# Patient Record
Sex: Male | Born: 1940 | Race: Black or African American | Hispanic: No | Marital: Married | Smoking: Former smoker
Health system: Southern US, Community
[De-identification: ages and names within clinical notes are randomized; demographics above are authoritative.]

## PROBLEM LIST (undated history)

## (undated) DIAGNOSIS — I119 Hypertensive heart disease without heart failure: Secondary | ICD-10-CM

## (undated) DIAGNOSIS — Z79899 Other long term (current) drug therapy: Secondary | ICD-10-CM

## (undated) DIAGNOSIS — R2240 Localized swelling, mass and lump, unspecified lower limb: Secondary | ICD-10-CM

## (undated) DIAGNOSIS — I4430 Unspecified atrioventricular block: Secondary | ICD-10-CM

## (undated) DIAGNOSIS — I509 Heart failure, unspecified: Secondary | ICD-10-CM

## (undated) DIAGNOSIS — R972 Elevated prostate specific antigen [PSA]: Secondary | ICD-10-CM

## (undated) DIAGNOSIS — E119 Type 2 diabetes mellitus without complications: Secondary | ICD-10-CM

## (undated) DIAGNOSIS — Z8546 Personal history of malignant neoplasm of prostate: Secondary | ICD-10-CM

## (undated) DIAGNOSIS — K219 Gastro-esophageal reflux disease without esophagitis: Secondary | ICD-10-CM

## (undated) DIAGNOSIS — R109 Unspecified abdominal pain: Secondary | ICD-10-CM

## (undated) DIAGNOSIS — H2640 Unspecified secondary cataract: Secondary | ICD-10-CM

## (undated) DIAGNOSIS — M1990 Unspecified osteoarthritis, unspecified site: Secondary | ICD-10-CM

## (undated) DIAGNOSIS — E7800 Pure hypercholesterolemia, unspecified: Secondary | ICD-10-CM

## (undated) DIAGNOSIS — W57XXXA Bitten or stung by nonvenomous insect and other nonvenomous arthropods, initial encounter: Secondary | ICD-10-CM

## (undated) DIAGNOSIS — N183 Chronic kidney disease, stage 3 unspecified: Secondary | ICD-10-CM

## (undated) DIAGNOSIS — I440 Atrioventricular block, first degree: Secondary | ICD-10-CM

## (undated) DIAGNOSIS — R0609 Other forms of dyspnea: Secondary | ICD-10-CM

## (undated) DIAGNOSIS — M25561 Pain in right knee: Secondary | ICD-10-CM

## (undated) DIAGNOSIS — M791 Myalgia, unspecified site: Secondary | ICD-10-CM

## (undated) DIAGNOSIS — I10 Essential (primary) hypertension: Secondary | ICD-10-CM

## (undated) DIAGNOSIS — E109 Type 1 diabetes mellitus without complications: Secondary | ICD-10-CM

## (undated) DIAGNOSIS — C61 Malignant neoplasm of prostate: Secondary | ICD-10-CM

## (undated) DIAGNOSIS — N189 Chronic kidney disease, unspecified: Secondary | ICD-10-CM

## (undated) HISTORY — DX: Pain in right knee: M25.561

## (undated) HISTORY — DX: Type 2 diabetes mellitus without complications: E11.9

## (undated) HISTORY — PX: PROSTATE BIOPSY: SHX241

## (undated) HISTORY — DX: Bitten or stung by nonvenomous insect and other nonvenomous arthropods, initial encounter: W57.XXXA

## (undated) HISTORY — DX: Chronic kidney disease, stage 3 unspecified: N18.30

## (undated) HISTORY — DX: Personal history of malignant neoplasm of prostate: Z85.46

## (undated) HISTORY — DX: Other long term (current) drug therapy: Z79.899

## (undated) HISTORY — DX: Hypertensive heart disease without heart failure: I11.9

## (undated) HISTORY — DX: Gastro-esophageal reflux disease without esophagitis: K21.9

## (undated) HISTORY — PX: TONSILLECTOMY: SUR1361

## (undated) HISTORY — DX: Myalgia, unspecified site: M79.10

## (undated) HISTORY — DX: Unspecified osteoarthritis, unspecified site: M19.90

## (undated) HISTORY — DX: Elevated prostate specific antigen (PSA): R97.20

## (undated) HISTORY — DX: Unspecified atrioventricular block: I44.30

## (undated) HISTORY — DX: Unspecified secondary cataract: H26.40

## (undated) HISTORY — DX: Other forms of dyspnea: R06.09

## (undated) HISTORY — DX: Pure hypercholesterolemia, unspecified: E78.00

## (undated) HISTORY — DX: Type 1 diabetes mellitus without complications: E10.9

## (undated) HISTORY — DX: Localized swelling, mass and lump, unspecified lower limb: R22.40

## (undated) HISTORY — DX: Unspecified abdominal pain: R10.9

## (undated) HISTORY — PX: PROSTATECTOMY: SHX69

---

## 2007-10-29 ENCOUNTER — Ambulatory Visit (HOSPITAL_COMMUNITY): Admission: RE | Admit: 2007-10-29 | Discharge: 2007-10-29 | Payer: Self-pay | Admitting: Urology

## 2009-09-13 ENCOUNTER — Ambulatory Visit: Admission: RE | Admit: 2009-09-13 | Discharge: 2009-12-03 | Payer: Self-pay | Admitting: Radiation Oncology

## 2009-10-04 ENCOUNTER — Ambulatory Visit (HOSPITAL_COMMUNITY): Admission: RE | Admit: 2009-10-04 | Discharge: 2009-10-04 | Payer: Self-pay | Admitting: Radiation Oncology

## 2009-12-06 ENCOUNTER — Ambulatory Visit: Admission: RE | Admit: 2009-12-06 | Discharge: 2009-12-10 | Payer: Self-pay | Admitting: Radiation Oncology

## 2010-12-25 ENCOUNTER — Encounter: Payer: Self-pay | Admitting: Urology

## 2013-10-10 ENCOUNTER — Other Ambulatory Visit (HOSPITAL_COMMUNITY): Payer: Self-pay | Admitting: Urology

## 2013-10-10 DIAGNOSIS — C61 Malignant neoplasm of prostate: Secondary | ICD-10-CM

## 2013-10-27 ENCOUNTER — Encounter (HOSPITAL_COMMUNITY): Payer: Self-pay

## 2013-10-27 ENCOUNTER — Encounter (HOSPITAL_COMMUNITY)
Admission: RE | Admit: 2013-10-27 | Discharge: 2013-10-27 | Disposition: A | Discharge status: Home / Self Care | Payer: Medicare Other | Source: Ambulatory Visit | Attending: Urology | Admitting: Urology

## 2013-10-27 DIAGNOSIS — C61 Malignant neoplasm of prostate: Secondary | ICD-10-CM

## 2013-10-27 HISTORY — DX: Malignant neoplasm of prostate: C61

## 2013-10-27 MED ORDER — TECHNETIUM TC 99M MEDRONATE IV KIT
26.0000 | PACK | Freq: Once | INTRAVENOUS | Status: AC | PRN
  Administered 2013-10-27: 26 via INTRAVENOUS

## 2014-12-15 ENCOUNTER — Other Ambulatory Visit (HOSPITAL_COMMUNITY): Payer: Self-pay | Admitting: Urology

## 2014-12-15 DIAGNOSIS — C61 Malignant neoplasm of prostate: Secondary | ICD-10-CM

## 2014-12-24 ENCOUNTER — Encounter (HOSPITAL_COMMUNITY)
Admission: RE | Admit: 2014-12-24 | Discharge: 2014-12-24 | Disposition: A | Discharge status: Home / Self Care | Payer: Medicare Other | Source: Ambulatory Visit | Attending: Urology | Admitting: Urology

## 2014-12-24 DIAGNOSIS — C61 Malignant neoplasm of prostate: Secondary | ICD-10-CM

## 2014-12-24 MED ORDER — TECHNETIUM TC 99M MEDRONATE IV KIT
25.0000 | PACK | Freq: Once | INTRAVENOUS | Status: AC | PRN
  Administered 2014-12-24: 25 via INTRAVENOUS

## 2015-02-16 ENCOUNTER — Other Ambulatory Visit (HOSPITAL_COMMUNITY): Payer: Self-pay | Admitting: Urology

## 2015-02-16 DIAGNOSIS — C61 Malignant neoplasm of prostate: Secondary | ICD-10-CM

## 2015-02-24 ENCOUNTER — Encounter (HOSPITAL_COMMUNITY)
Admission: RE | Admit: 2015-02-24 | Discharge: 2015-02-24 | Disposition: A | Discharge status: Home / Self Care | Payer: Medicare Other | Source: Ambulatory Visit | Attending: Urology | Admitting: Urology

## 2015-02-24 DIAGNOSIS — C61 Malignant neoplasm of prostate: Secondary | ICD-10-CM | POA: Insufficient documentation

## 2015-02-24 MED ORDER — TECHNETIUM TC 99M MEDRONATE IV KIT
25.0000 | PACK | Freq: Once | INTRAVENOUS | Status: AC | PRN
  Administered 2015-02-24: 25 via INTRAVENOUS

## 2015-05-20 ENCOUNTER — Other Ambulatory Visit (HOSPITAL_COMMUNITY): Payer: Self-pay | Admitting: Urology

## 2015-05-20 DIAGNOSIS — C61 Malignant neoplasm of prostate: Secondary | ICD-10-CM

## 2015-08-17 ENCOUNTER — Ambulatory Visit (HOSPITAL_COMMUNITY)
Admission: RE | Admit: 2015-08-17 | Discharge: 2015-08-17 | Disposition: A | Discharge status: Home / Self Care | Payer: Self-pay | Source: Ambulatory Visit | Attending: Urology | Admitting: Urology

## 2015-08-17 ENCOUNTER — Encounter (HOSPITAL_COMMUNITY)
Admission: RE | Admit: 2015-08-17 | Discharge: 2015-08-17 | Disposition: A | Discharge status: Home / Self Care | Payer: Self-pay | Source: Ambulatory Visit | Attending: Urology | Admitting: Urology

## 2015-08-17 DIAGNOSIS — C61 Malignant neoplasm of prostate: Secondary | ICD-10-CM | POA: Insufficient documentation

## 2015-08-17 MED ORDER — TECHNETIUM TC 99M MEDRONATE IV KIT
26.5000 | PACK | Freq: Once | INTRAVENOUS | Status: AC | PRN
  Administered 2015-08-17: 26.5 via INTRAVENOUS

## 2015-09-27 ENCOUNTER — Encounter: Payer: Self-pay | Admitting: Interventional Cardiology

## 2015-09-29 DIAGNOSIS — R9431 Abnormal electrocardiogram [ECG] [EKG]: Secondary | ICD-10-CM | POA: Insufficient documentation

## 2015-09-30 ENCOUNTER — Ambulatory Visit (INDEPENDENT_AMBULATORY_CARE_PROVIDER_SITE_OTHER): Payer: Medicare Other | Admitting: Interventional Cardiology

## 2015-09-30 ENCOUNTER — Encounter: Payer: Self-pay | Admitting: Interventional Cardiology

## 2015-09-30 VITALS — BP 160/84 | HR 66 | Ht 67.5 in | Wt 218.1 lb

## 2015-09-30 DIAGNOSIS — R9431 Abnormal electrocardiogram [ECG] [EKG]: Secondary | ICD-10-CM | POA: Diagnosis not present

## 2015-09-30 DIAGNOSIS — N182 Chronic kidney disease, stage 2 (mild): Secondary | ICD-10-CM

## 2015-09-30 DIAGNOSIS — I10 Essential (primary) hypertension: Secondary | ICD-10-CM | POA: Diagnosis not present

## 2015-09-30 DIAGNOSIS — I5032 Chronic diastolic (congestive) heart failure: Secondary | ICD-10-CM | POA: Diagnosis not present

## 2015-09-30 DIAGNOSIS — E1122 Type 2 diabetes mellitus with diabetic chronic kidney disease: Secondary | ICD-10-CM

## 2015-09-30 DIAGNOSIS — E119 Type 2 diabetes mellitus without complications: Secondary | ICD-10-CM

## 2015-09-30 LAB — BASIC METABOLIC PANEL
BUN: 24 mg/dL (ref 7–25)
CALCIUM: 8.9 mg/dL (ref 8.6–10.3)
CO2: 26 mmol/L (ref 20–31)
Chloride: 102 mmol/L (ref 98–110)
Creat: 1.1 mg/dL (ref 0.70–1.18)
GLUCOSE: 74 mg/dL (ref 65–99)
Potassium: 4.2 mmol/L (ref 3.5–5.3)
SODIUM: 138 mmol/L (ref 135–146)

## 2015-09-30 LAB — BRAIN NATRIURETIC PEPTIDE: BRAIN NATRIURETIC PEPTIDE: 141.8 pg/mL — AB (ref 0.0–100.0)

## 2015-09-30 NOTE — Progress Notes (Signed)
Cardiology Office Note   Date:  09/30/2015   ID:  [name], DOB 11/16/1941, MRN [id number]  PCP:  Ian Hey, MD  Cardiologist:  Sinclair Grooms, MD   Chief Complaint  Patient presents with  . Abnormal ECG      History of Present Illness: Ian Dudley is a 74 y.o. male who presents for abnormal EKG.  The patient has recurrent prostate cancer and was referred by [facility], Dr. Janice Dudley. He was enrolled in a drug therapy trial that required an electrocardiogram. When this was performed in March 2016 there was evidence of inferolateral T-wave inversion compatible with ischemia. This prompted the cardiology referral.  In speaking with the patient there is no dyspnea at rest, chest discomfort, arm discomfort, or other ischemic complaints. He does note gradual exertional fatigue over the last several years. There is no arm discomfort, or claudication.  He has history of recurrent prostate cancer and is on an experimental agent. This is causing him to develop breast tissue/gynecomastia. Otherwise no complaints.    Past Medical History  Diagnosis Date  . Prostate cancer ([place])   . Diabetes mellitus type I [facility])     Past Surgical History  Procedure Laterality Date  . Prostatectomy    . Tonsillectomy       Current Outpatient Prescriptions  Medication Sig Dispense Refill  . aspirin 325 MG tablet Take 325 mg by mouth daily as needed for mild pain.    Marland Kitchen glipiZIDE (GLUCOTROL) 10 MG tablet Take 10 mg by mouth daily before breakfast.    . Investigational - Study Medication Take 4 tablets by mouth daily. Additional Study Details: For Prostate Cancer    . metFORMIN (GLUCOPHAGE) 500 MG tablet Take 500 mg by mouth daily with breakfast.     . pioglitazone (ACTOS) 30 MG tablet Take 30 mg by mouth daily.     No current facility-administered medications for this visit.    Allergies:   Review of patient's allergies indicates no known allergies.    Social  History:  The patient  reports that he has quit smoking. He has never used smokeless tobacco. He reports that he does not drink alcohol or use illicit drugs.   Family History:  The patient's family history includes Breast cancer in his sister; Colon cancer in his brother and sister; Diabetes in his mother; Healthy in his brother, brother, and sister; Hypertension in his mother; Kidney failure in his mother; Lupus in his sister; Prostate cancer in his brother.    ROS:  Please see the history of present illness.   Otherwise, review of systems are positive for gynecomastia..   All other systems are reviewed and negative.    PHYSICAL EXAM: VS:  BP 160/84 mmHg  Pulse 66  Ht 5' 7.5" (1.715 m)  Wt 98.939 kg (218 lb 1.9 oz)  BMI 33.64 kg/m2 , BMI Body mass index is 33.64 kg/(m^2). GEN: Well nourished, well developed, in no acute distress HEENT: normal Neck: no JVD, carotid bruits, or masses Cardiac: RRR.  There is no murmur, rub, or gallop. There is no edema. Respiratory:  clear to auscultation bilaterally, normal work of breathing. GI: soft, nontender, nondistended, + BS MS: no deformity or atrophy Skin: warm and dry, no rash Neuro:  Strength and sensation are intact Psych: euthymic mood, full affect   EKG:  EKG is ordered today. The ekg reveals normal sinus rhythm with nonspecific T-wave abnormality.   Recent Labs: No results found for requested  labs within last 365 days.    Lipid Panel No results found for: CHOL, TRIG, HDL, CHOLHDL, VLDL, LDLCALC, LDLDIRECT    Wt Readings from Last 3 Encounters:  09/30/15 98.939 kg (218 lb 1.9 oz)      Other studies Reviewed: Additional studies/ records that were reviewed today include: Review of [facility] neurology data. The findings include the EKG of concern reveal clear inferolateral T-wave inversion potentially consistent with ischemia..    ASSESSMENT AND PLAN:  1. Abnormal EKG with T-wave abnormality EKG performed earlier this  year demonstrated symmetrical inferior T-wave abnormality consistent with ischemia. Current EKG is not consistent and only reveals nonspecific ST abnormality. Could have CAD and silent ischemia.  2. Possible diastolic heart failure There is clinical evidence of volume overload.   3. Elevated blood pressure and probable essential hypertension Markedly elevated systolic pressure  4. Prostate cancer, metastatic On study drug  5. 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  Current medicines are reviewed at length with the patient today.  The patient has the following concerns regarding medicines: none..  The following changes/actions have been instituted:    Basic metabolic panel and BNP  No concern about the patient's current EKG appearance with reference to his current study med.  It basic metabolic panel is okay, will start antihypertensive regimen that that includes an Ace or ARB given the diabetes history. Will also need a diuretic regimen is there is evidence of volume overload.  Myocardial perfusion imaging  Labs/ tests ordered today include:   Orders Placed This Encounter  Procedures  . B Nat Peptide  . Basic Metabolic Panel (BMET)  . Myocardial Perfusion Imaging  . EKG 12-Lead     Disposition:   FU with HS in PRN    Signed, Sinclair Grooms, MD  09/30/2015 1:46 PM    [facility] [address], [place], [place]  [address] Phone: [phone]; Fax: [phone]

## 2015-09-30 NOTE — Patient Instructions (Signed)
Your physician recommends that you continue on your current medications as directed. Please refer to the Current Medication list given to you today.  Your physician recommends that you return for lab work in: today (BNP, BMET)  Follow up with your physician will depend on test results.

## 2015-10-04 ENCOUNTER — Telehealth: Payer: Self-pay

## 2015-10-04 NOTE — Telephone Encounter (Signed)
-----   Message from Belva Crome, MD sent at 09/30/2015  6:54 PM EDT ----- Normal

## 2015-10-04 NOTE — Telephone Encounter (Signed)
Called to give pt lab results.lmtcb  

## 2015-10-06 NOTE — Telephone Encounter (Signed)
Called to give pt lab results. lmom labs are normal

## 2015-10-06 NOTE — Telephone Encounter (Signed)
F/u    Pt returning Lisa's call.

## 2015-10-11 ENCOUNTER — Telehealth (HOSPITAL_COMMUNITY): Payer: Self-pay

## 2015-10-11 ENCOUNTER — Other Ambulatory Visit: Payer: Self-pay | Admitting: Urology

## 2015-10-11 DIAGNOSIS — C61 Malignant neoplasm of prostate: Secondary | ICD-10-CM

## 2015-10-11 NOTE — Telephone Encounter (Signed)
Patient given detailed instructions per Myocardial Perfusion Study Information Sheet for the test on 10-13-2015 at 1000. Patient notified to arrive 15 minutes early and that it is imperative to arrive on time for appointment to keep from having the test rescheduled.  If you need to cancel or reschedule your appointment, please call the office within 24 hours of your appointment. Failure to do so may result in a cancellation of your appointment, and a $50 no show fee. Patient verbalized understanding.Oletta Lamas, Mikiah Durall A

## 2015-10-13 ENCOUNTER — Ambulatory Visit (HOSPITAL_COMMUNITY): Payer: Medicare Other | Attending: Cardiology

## 2015-10-13 DIAGNOSIS — R5383 Other fatigue: Secondary | ICD-10-CM | POA: Insufficient documentation

## 2015-10-13 DIAGNOSIS — I5032 Chronic diastolic (congestive) heart failure: Secondary | ICD-10-CM | POA: Diagnosis not present

## 2015-10-13 DIAGNOSIS — E119 Type 2 diabetes mellitus without complications: Secondary | ICD-10-CM | POA: Insufficient documentation

## 2015-10-13 DIAGNOSIS — R0609 Other forms of dyspnea: Secondary | ICD-10-CM | POA: Insufficient documentation

## 2015-10-13 DIAGNOSIS — R9431 Abnormal electrocardiogram [ECG] [EKG]: Secondary | ICD-10-CM | POA: Insufficient documentation

## 2015-10-13 DIAGNOSIS — I10 Essential (primary) hypertension: Secondary | ICD-10-CM | POA: Insufficient documentation

## 2015-10-13 DIAGNOSIS — R002 Palpitations: Secondary | ICD-10-CM | POA: Insufficient documentation

## 2015-10-13 LAB — MYOCARDIAL PERFUSION IMAGING
CHL CUP RESTING HR STRESS: 67 {beats}/min
LV sys vol: 32 mL
LVDIAVOL: 113 mL
NUC STRESS TID: 1.07
Peak HR: 87 {beats}/min
RATE: 0.27
SDS: 1
SRS: 8
SSS: 9

## 2015-10-13 MED ORDER — TECHNETIUM TC 99M SESTAMIBI GENERIC - CARDIOLITE
32.8000 | Freq: Once | INTRAVENOUS | Status: AC | PRN
  Administered 2015-10-13: 32.8 via INTRAVENOUS

## 2015-10-13 MED ORDER — REGADENOSON 0.4 MG/5ML IV SOLN
0.4000 mg | Freq: Once | INTRAVENOUS | Status: AC
  Administered 2015-10-13: 0.4 mg via INTRAVENOUS

## 2015-10-13 MED ORDER — TECHNETIUM TC 99M SESTAMIBI GENERIC - CARDIOLITE
10.8000 | Freq: Once | INTRAVENOUS | Status: AC | PRN
  Administered 2015-10-13: 11 via INTRAVENOUS

## 2015-10-19 ENCOUNTER — Telehealth: Payer: Self-pay

## 2015-10-19 DIAGNOSIS — I10 Essential (primary) hypertension: Secondary | ICD-10-CM

## 2015-10-19 MED ORDER — LISINOPRIL-HYDROCHLOROTHIAZIDE 10-12.5 MG PO TABS: 1.0000 | ORAL_TABLET | Freq: Every day | ORAL | Status: DC

## 2015-10-19 NOTE — Telephone Encounter (Signed)
-----   Message from Belva Crome, MD sent at 10/14/2015  5:33 PM EST ----- The stress test is normal showing no evidence of blocked arteries or muscle weakness. Laboratory data previously reported, is normal. High blood pressure is present. Start lisinopril HCT 10/12.5 mg daily. He needs a repeat basic metabolic panel in 2 weeks and blood pressure check in blood pressure clinic.

## 2015-10-19 NOTE — Telephone Encounter (Signed)
Pt aware of myoview results and Dr.smith's recommendation. The stress test is normal showing no evidence of blocked arteries or muscle weakness. Laboratory data previously reported, is normal.  High blood pressure is present. Start lisinopril HCT 10/12.5 mg daily. He needs a repeat basic metabolic panel in 2 weeks and blood pressure check in blood pressure clinic. Pt agreeable with plan. Rx sent to pt pharmacy lab appt scheduled for 11/29. Pt will measure his bp daily x2 weeks and drop off his bp readings for Dr.Smith to review when he comes in for labs.

## 2015-11-02 ENCOUNTER — Other Ambulatory Visit (INDEPENDENT_AMBULATORY_CARE_PROVIDER_SITE_OTHER): Payer: Medicare Other

## 2015-11-02 ENCOUNTER — Telehealth: Payer: Self-pay | Admitting: Interventional Cardiology

## 2015-11-02 DIAGNOSIS — I10 Essential (primary) hypertension: Secondary | ICD-10-CM | POA: Diagnosis not present

## 2015-11-02 LAB — BASIC METABOLIC PANEL
BUN: 24 mg/dL (ref 7–25)
CALCIUM: 8.8 mg/dL (ref 8.6–10.3)
CO2: 28 mmol/L (ref 20–31)
Chloride: 103 mmol/L (ref 98–110)
Creat: 1.17 mg/dL (ref 0.70–1.18)
GLUCOSE: 91 mg/dL (ref 65–99)
POTASSIUM: 5 mmol/L (ref 3.5–5.3)
SODIUM: 138 mmol/L (ref 135–146)

## 2015-11-02 NOTE — Telephone Encounter (Signed)
Walk in pt form-BP readings dropped off-gave to Lisa °

## 2015-11-03 NOTE — Progress Notes (Signed)
Labs are good. Patient is on lisinopril HCTZ. No further changes needed.

## 2015-11-10 ENCOUNTER — Telehealth: Payer: Self-pay

## 2015-11-10 NOTE — Telephone Encounter (Signed)
-----   Message from Belva Crome, MD sent at 11/02/2015  6:20 PM EST ----- Start hydrochlorothiazide 12.5 mg daily.   Repeat basic metabolic panel in 2 weeks.

## 2015-11-10 NOTE — Telephone Encounter (Signed)
Lmtcb. Per Dr.Smith pt labs are ok. The BP readings pt dropped off were good. No changes needed

## 2015-11-11 NOTE — Telephone Encounter (Signed)
F/u   Pt returning rn phone call- can use mobile #

## 2015-11-11 NOTE — Telephone Encounter (Signed)
Pt aware of lab results, and that Dr.Smith was pleased with the  BP readings pt dropped off. Pt would like to know when or if he needs a f/u appt. Adv pt that I will fwd Dr.Smith a message and call back with his response. Pt verbalized understanding.

## 2015-11-18 NOTE — Telephone Encounter (Signed)
Pt aware of Dr.Smith's response to f/u with him prn. Pt verbalized understanding.

## 2015-11-18 NOTE — Telephone Encounter (Signed)
Follow with his PCP unless problems.

## 2016-01-25 ENCOUNTER — Encounter (HOSPITAL_COMMUNITY): Payer: Medicare Other

## 2016-01-26 ENCOUNTER — Encounter (HOSPITAL_COMMUNITY): Payer: Medicare Other

## 2016-01-31 ENCOUNTER — Ambulatory Visit (HOSPITAL_COMMUNITY)
Admission: RE | Admit: 2016-01-31 | Discharge: 2016-01-31 | Disposition: A | Discharge status: Home / Self Care | Payer: Medicare Other | Source: Ambulatory Visit | Attending: Urology | Admitting: Urology

## 2016-01-31 ENCOUNTER — Encounter (HOSPITAL_COMMUNITY)
Admission: RE | Admit: 2016-01-31 | Discharge: 2016-01-31 | Disposition: A | Discharge status: Home / Self Care | Payer: Medicare Other | Source: Ambulatory Visit | Attending: Urology | Admitting: Urology

## 2016-01-31 DIAGNOSIS — C61 Malignant neoplasm of prostate: Secondary | ICD-10-CM | POA: Insufficient documentation

## 2016-01-31 MED ORDER — TECHNETIUM TC 99M MEDRONATE IV KIT
27.2000 | PACK | Freq: Once | INTRAVENOUS | Status: AC | PRN
  Administered 2016-01-31: 27.2 via INTRAVENOUS

## 2016-04-18 ENCOUNTER — Other Ambulatory Visit: Payer: Self-pay | Admitting: Urology

## 2016-04-18 DIAGNOSIS — C61 Malignant neoplasm of prostate: Secondary | ICD-10-CM

## 2016-07-18 ENCOUNTER — Encounter (HOSPITAL_COMMUNITY)
Admission: RE | Admit: 2016-07-18 | Discharge: 2016-07-18 | Disposition: A | Discharge status: Home / Self Care | Payer: Medicare Other | Source: Ambulatory Visit | Attending: Urology | Admitting: Urology

## 2016-07-18 ENCOUNTER — Ambulatory Visit (HOSPITAL_COMMUNITY)
Admission: RE | Admit: 2016-07-18 | Discharge: 2016-07-18 | Disposition: A | Discharge status: Home / Self Care | Payer: Medicare Other | Source: Ambulatory Visit | Attending: Urology | Admitting: Urology

## 2016-07-18 DIAGNOSIS — C61 Malignant neoplasm of prostate: Secondary | ICD-10-CM | POA: Insufficient documentation

## 2016-07-18 MED ORDER — TECHNETIUM TC 99M MEDRONATE IV KIT
25.0000 | PACK | Freq: Once | INTRAVENOUS | Status: AC | PRN
  Administered 2016-07-18: 20.6 via INTRAVENOUS

## 2016-08-18 ENCOUNTER — Other Ambulatory Visit: Payer: Self-pay | Admitting: Interventional Cardiology

## 2016-08-18 DIAGNOSIS — I10 Essential (primary) hypertension: Secondary | ICD-10-CM

## 2016-09-28 DIAGNOSIS — H40013 Open angle with borderline findings, low risk, bilateral: Secondary | ICD-10-CM | POA: Diagnosis not present

## 2016-09-28 DIAGNOSIS — H25013 Cortical age-related cataract, bilateral: Secondary | ICD-10-CM | POA: Diagnosis not present

## 2016-09-28 DIAGNOSIS — E119 Type 2 diabetes mellitus without complications: Secondary | ICD-10-CM | POA: Diagnosis not present

## 2016-09-28 DIAGNOSIS — H35033 Hypertensive retinopathy, bilateral: Secondary | ICD-10-CM | POA: Diagnosis not present

## 2016-10-18 ENCOUNTER — Other Ambulatory Visit: Payer: Self-pay | Admitting: Urology

## 2016-10-18 DIAGNOSIS — C61 Malignant neoplasm of prostate: Secondary | ICD-10-CM

## 2016-10-31 DIAGNOSIS — H019 Unspecified inflammation of eyelid: Secondary | ICD-10-CM | POA: Diagnosis not present

## 2016-12-25 DIAGNOSIS — C61 Malignant neoplasm of prostate: Secondary | ICD-10-CM | POA: Diagnosis not present

## 2017-01-01 ENCOUNTER — Encounter (HOSPITAL_COMMUNITY)
Admission: RE | Admit: 2017-01-01 | Discharge: 2017-01-01 | Disposition: A | Discharge status: Home / Self Care | Payer: Medicare Other | Source: Ambulatory Visit | Attending: Urology | Admitting: Urology

## 2017-01-01 DIAGNOSIS — C61 Malignant neoplasm of prostate: Secondary | ICD-10-CM

## 2017-01-01 MED ORDER — TECHNETIUM TC 99M MEDRONATE IV KIT
25.0000 | PACK | Freq: Once | INTRAVENOUS | Status: AC | PRN
  Administered 2017-01-01: 25 via INTRAVENOUS

## 2017-01-23 ENCOUNTER — Other Ambulatory Visit: Payer: Self-pay | Admitting: Urology

## 2017-01-23 DIAGNOSIS — C61 Malignant neoplasm of prostate: Secondary | ICD-10-CM

## 2017-02-25 ENCOUNTER — Other Ambulatory Visit: Payer: Self-pay | Admitting: Interventional Cardiology

## 2017-02-25 DIAGNOSIS — I10 Essential (primary) hypertension: Secondary | ICD-10-CM

## 2017-03-05 ENCOUNTER — Other Ambulatory Visit: Payer: Self-pay | Admitting: Interventional Cardiology

## 2017-03-05 DIAGNOSIS — I10 Essential (primary) hypertension: Secondary | ICD-10-CM

## 2017-03-07 MED ORDER — LISINOPRIL-HYDROCHLOROTHIAZIDE 10-12.5 MG PO TABS: 1.0000 | ORAL_TABLET | Freq: Every day | ORAL | 0 refills | Status: AC

## 2017-03-07 NOTE — Telephone Encounter (Signed)
PCP.  Thanks.

## 2017-03-07 NOTE — Telephone Encounter (Signed)
He needs to have his medications refilled for one month. She needs to return to his primary care for long-term follow-up. We don't need to see him for management of blood pressure.

## 2017-03-07 NOTE — Addendum Note (Signed)
Addended by: Gaetano Net on: 03/07/2017 04:11 PM   Modules accepted: Orders

## 2017-03-27 DIAGNOSIS — H04123 Dry eye syndrome of bilateral lacrimal glands: Secondary | ICD-10-CM | POA: Diagnosis not present

## 2017-03-27 DIAGNOSIS — H40013 Open angle with borderline findings, low risk, bilateral: Secondary | ICD-10-CM | POA: Diagnosis not present

## 2017-06-11 DIAGNOSIS — C61 Malignant neoplasm of prostate: Secondary | ICD-10-CM | POA: Diagnosis not present

## 2017-06-18 ENCOUNTER — Encounter (HOSPITAL_COMMUNITY)
Admission: RE | Admit: 2017-06-18 | Discharge: 2017-06-18 | Disposition: A | Discharge status: Home / Self Care | Payer: Medicare Other | Source: Ambulatory Visit | Attending: Urology | Admitting: Urology

## 2017-06-18 ENCOUNTER — Ambulatory Visit (HOSPITAL_COMMUNITY)
Admission: RE | Admit: 2017-06-18 | Discharge: 2017-06-18 | Disposition: A | Discharge status: Home / Self Care | Payer: Medicare Other | Source: Ambulatory Visit | Attending: Urology | Admitting: Urology

## 2017-06-18 DIAGNOSIS — C61 Malignant neoplasm of prostate: Secondary | ICD-10-CM

## 2017-06-18 MED ORDER — TECHNETIUM TC 99M MEDRONATE IV KIT
20.9000 | PACK | Freq: Once | INTRAVENOUS | Status: AC | PRN
  Administered 2017-06-18: 20.9 via INTRAVENOUS

## 2017-09-17 ENCOUNTER — Other Ambulatory Visit: Payer: Self-pay | Admitting: Urology

## 2017-09-17 DIAGNOSIS — C61 Malignant neoplasm of prostate: Secondary | ICD-10-CM

## 2017-09-20 DIAGNOSIS — N3001 Acute cystitis with hematuria: Secondary | ICD-10-CM | POA: Diagnosis not present

## 2017-10-04 DIAGNOSIS — N3001 Acute cystitis with hematuria: Secondary | ICD-10-CM | POA: Diagnosis not present

## 2017-10-05 DIAGNOSIS — H2513 Age-related nuclear cataract, bilateral: Secondary | ICD-10-CM | POA: Diagnosis not present

## 2017-10-05 DIAGNOSIS — H40013 Open angle with borderline findings, low risk, bilateral: Secondary | ICD-10-CM | POA: Diagnosis not present

## 2017-10-05 DIAGNOSIS — H25013 Cortical age-related cataract, bilateral: Secondary | ICD-10-CM | POA: Diagnosis not present

## 2017-10-05 DIAGNOSIS — E119 Type 2 diabetes mellitus without complications: Secondary | ICD-10-CM | POA: Diagnosis not present

## 2017-12-03 ENCOUNTER — Encounter (HOSPITAL_COMMUNITY): Payer: Medicare Other

## 2017-12-03 ENCOUNTER — Encounter (HOSPITAL_COMMUNITY): Admission: RE | Admit: 2017-12-03 | Payer: Medicare Other | Source: Ambulatory Visit

## 2017-12-03 DIAGNOSIS — N183 Chronic kidney disease, stage 3 (moderate): Secondary | ICD-10-CM | POA: Diagnosis not present

## 2017-12-03 DIAGNOSIS — N390 Urinary tract infection, site not specified: Secondary | ICD-10-CM | POA: Diagnosis not present

## 2017-12-03 DIAGNOSIS — C61 Malignant neoplasm of prostate: Secondary | ICD-10-CM | POA: Diagnosis not present

## 2017-12-07 ENCOUNTER — Encounter (HOSPITAL_COMMUNITY)
Admission: RE | Admit: 2017-12-07 | Discharge: 2017-12-07 | Disposition: A | Discharge status: Home / Self Care | Payer: Medicare Other | Source: Ambulatory Visit | Attending: Urology | Admitting: Urology

## 2017-12-07 DIAGNOSIS — C61 Malignant neoplasm of prostate: Secondary | ICD-10-CM | POA: Diagnosis not present

## 2017-12-07 MED ORDER — TECHNETIUM TC 99M MEDRONATE IV KIT
25.0000 | PACK | Freq: Once | INTRAVENOUS | Status: AC | PRN
  Administered 2017-12-07: 22 via INTRAVENOUS

## 2018-02-25 ENCOUNTER — Other Ambulatory Visit: Payer: Self-pay | Admitting: Urology

## 2018-02-25 DIAGNOSIS — C61 Malignant neoplasm of prostate: Secondary | ICD-10-CM | POA: Diagnosis not present

## 2018-04-05 DIAGNOSIS — H40013 Open angle with borderline findings, low risk, bilateral: Secondary | ICD-10-CM | POA: Diagnosis not present

## 2018-05-07 DIAGNOSIS — E7800 Pure hypercholesterolemia, unspecified: Secondary | ICD-10-CM | POA: Diagnosis not present

## 2018-05-07 DIAGNOSIS — Z8546 Personal history of malignant neoplasm of prostate: Secondary | ICD-10-CM | POA: Diagnosis not present

## 2018-05-07 DIAGNOSIS — E1165 Type 2 diabetes mellitus with hyperglycemia: Secondary | ICD-10-CM | POA: Diagnosis not present

## 2018-05-07 DIAGNOSIS — I119 Hypertensive heart disease without heart failure: Secondary | ICD-10-CM | POA: Diagnosis not present

## 2018-05-07 DIAGNOSIS — Z79899 Other long term (current) drug therapy: Secondary | ICD-10-CM | POA: Diagnosis not present

## 2018-05-22 DIAGNOSIS — C61 Malignant neoplasm of prostate: Secondary | ICD-10-CM | POA: Diagnosis not present

## 2018-05-24 ENCOUNTER — Ambulatory Visit (HOSPITAL_COMMUNITY)
Admission: RE | Admit: 2018-05-24 | Discharge: 2018-05-24 | Disposition: A | Discharge status: Home / Self Care | Payer: Medicare Other | Source: Ambulatory Visit | Attending: Urology | Admitting: Urology

## 2018-05-24 DIAGNOSIS — C61 Malignant neoplasm of prostate: Secondary | ICD-10-CM | POA: Insufficient documentation

## 2018-05-24 MED ORDER — TECHNETIUM TC 99M MEDRONATE IV KIT
20.0000 | PACK | Freq: Once | INTRAVENOUS | Status: AC | PRN
  Administered 2018-05-24: 19.9 via INTRAVENOUS

## 2018-05-27 ENCOUNTER — Encounter (HOSPITAL_COMMUNITY): Payer: Medicare Other

## 2018-08-27 ENCOUNTER — Other Ambulatory Visit: Payer: Self-pay | Admitting: Urology

## 2018-08-27 DIAGNOSIS — C61 Malignant neoplasm of prostate: Secondary | ICD-10-CM

## 2018-10-24 ENCOUNTER — Ambulatory Visit (HOSPITAL_COMMUNITY)
Admission: RE | Admit: 2018-10-24 | Discharge: 2018-10-24 | Disposition: A | Discharge status: Home / Self Care | Payer: Medicare Other | Source: Ambulatory Visit | Attending: Urology | Admitting: Urology

## 2018-10-24 DIAGNOSIS — C61 Malignant neoplasm of prostate: Secondary | ICD-10-CM | POA: Insufficient documentation

## 2018-10-24 MED ORDER — TECHNETIUM TC 99M MEDRONATE IV KIT
21.6000 | PACK | Freq: Once | INTRAVENOUS | Status: AC | PRN
  Administered 2018-10-24: 21.6 via INTRAVENOUS

## 2018-10-29 DIAGNOSIS — H25013 Cortical age-related cataract, bilateral: Secondary | ICD-10-CM | POA: Diagnosis not present

## 2018-10-29 DIAGNOSIS — H35033 Hypertensive retinopathy, bilateral: Secondary | ICD-10-CM | POA: Diagnosis not present

## 2018-10-29 DIAGNOSIS — E119 Type 2 diabetes mellitus without complications: Secondary | ICD-10-CM | POA: Diagnosis not present

## 2018-10-29 DIAGNOSIS — H2513 Age-related nuclear cataract, bilateral: Secondary | ICD-10-CM | POA: Diagnosis not present

## 2018-10-29 DIAGNOSIS — H40013 Open angle with borderline findings, low risk, bilateral: Secondary | ICD-10-CM | POA: Diagnosis not present

## 2019-04-21 ENCOUNTER — Other Ambulatory Visit: Payer: Self-pay | Admitting: Urology

## 2019-04-21 ENCOUNTER — Other Ambulatory Visit (HOSPITAL_COMMUNITY): Payer: Self-pay | Admitting: Urology

## 2019-04-21 DIAGNOSIS — C61 Malignant neoplasm of prostate: Secondary | ICD-10-CM

## 2019-04-24 ENCOUNTER — Ambulatory Visit (HOSPITAL_COMMUNITY): Payer: Medicare Other

## 2019-05-06 ENCOUNTER — Encounter (HOSPITAL_COMMUNITY)
Admission: RE | Admit: 2019-05-06 | Discharge: 2019-05-06 | Disposition: A | Discharge status: Home / Self Care | Payer: Medicare Other | Source: Ambulatory Visit | Attending: Urology | Admitting: Urology

## 2019-05-06 ENCOUNTER — Other Ambulatory Visit: Payer: Self-pay

## 2019-05-06 DIAGNOSIS — C61 Malignant neoplasm of prostate: Secondary | ICD-10-CM | POA: Insufficient documentation

## 2019-05-06 MED ORDER — TECHNETIUM TC 99M MEDRONATE IV KIT
21.7000 | PACK | Freq: Once | INTRAVENOUS | Status: AC | PRN
  Administered 2019-05-06: 21.7 via INTRAVENOUS

## 2019-10-06 ENCOUNTER — Other Ambulatory Visit (HOSPITAL_COMMUNITY): Payer: Self-pay | Admitting: Urology

## 2019-10-06 ENCOUNTER — Other Ambulatory Visit: Payer: Self-pay | Admitting: Urology

## 2019-10-06 DIAGNOSIS — C61 Malignant neoplasm of prostate: Secondary | ICD-10-CM

## 2019-11-05 ENCOUNTER — Other Ambulatory Visit (HOSPITAL_COMMUNITY): Payer: Self-pay | Admitting: Pulmonary Disease

## 2019-11-05 DIAGNOSIS — R52 Pain, unspecified: Secondary | ICD-10-CM

## 2019-11-06 ENCOUNTER — Ambulatory Visit (HOSPITAL_COMMUNITY)
Admission: RE | Admit: 2019-11-06 | Discharge: 2019-11-06 | Disposition: A | Discharge status: Home / Self Care | Payer: Medicare Other | Source: Ambulatory Visit | Attending: Pulmonary Disease | Admitting: Pulmonary Disease

## 2019-11-06 ENCOUNTER — Other Ambulatory Visit (HOSPITAL_COMMUNITY): Payer: Medicare Other

## 2019-11-06 ENCOUNTER — Ambulatory Visit (HOSPITAL_COMMUNITY): Payer: Medicare Other

## 2019-11-06 ENCOUNTER — Other Ambulatory Visit (HOSPITAL_COMMUNITY): Payer: Self-pay | Admitting: Pulmonary Disease

## 2019-11-06 ENCOUNTER — Other Ambulatory Visit: Payer: Self-pay

## 2019-11-06 DIAGNOSIS — R52 Pain, unspecified: Secondary | ICD-10-CM | POA: Insufficient documentation

## 2019-11-06 DIAGNOSIS — M79604 Pain in right leg: Secondary | ICD-10-CM

## 2019-11-06 NOTE — Progress Notes (Signed)
LE venous duplex       has been completed. Preliminary results can be found under CV proc through chart review. June Leap, BS, RDMS, RVT    Called results to Dr. Oralia Rud office

## 2019-11-11 ENCOUNTER — Encounter (HOSPITAL_COMMUNITY)
Admission: RE | Admit: 2019-11-11 | Discharge: 2019-11-11 | Disposition: A | Discharge status: Home / Self Care | Payer: Self-pay | Source: Ambulatory Visit | Attending: Urology | Admitting: Urology

## 2019-11-11 ENCOUNTER — Other Ambulatory Visit: Payer: Self-pay

## 2019-11-11 DIAGNOSIS — C61 Malignant neoplasm of prostate: Secondary | ICD-10-CM | POA: Insufficient documentation

## 2019-11-11 MED ORDER — TECHNETIUM TC 99M MEDRONATE IV KIT
22.0000 | PACK | Freq: Once | INTRAVENOUS | Status: AC
  Administered 2019-11-11: 09:00:00 22 via INTRAVENOUS

## 2019-11-21 IMAGING — NM NUCLEAR MEDICINE WHOLE BODY BONE SCINTIGRAPHY
2 series · 2 of 2 positions shown · non-contrast
Comparison: 10/24/2018, Baseline bone scan 06/18/2017

ZUABP Measurements for Bone Metastasis:

No evidence of skeletal metastasis

CLINICAL DATA: Prostate cancer.  EMBARK STUDY (MDV 3511-13)

EXAM:
NUCLEAR MEDICINE WHOLE BODY BONE SCAN
TECHNIQUE: Whole body anterior and posterior images were obtained approximately
3 hours after intravenous injection of radiopharmaceutical.
RADIOPHARMACEUTICALS:  21.3 mCi Lechnetium-00m MDP IV

[Series 1: wbr_bone_40 whole body · 2.66mm/px · 1 of 1 slices shown (1 of 2)]
[im 1/1]
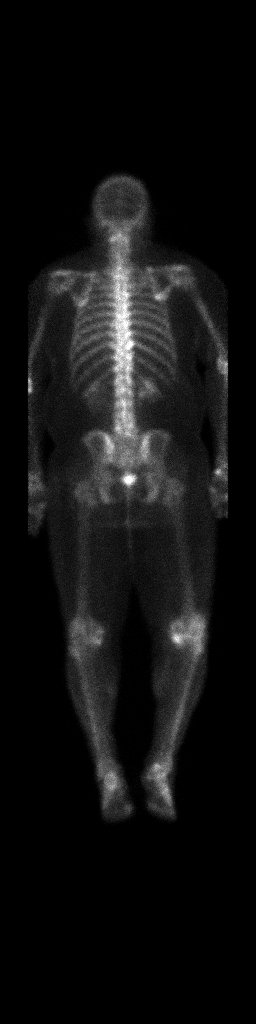

[Series 1: wbr_bone_40 whole body · 2.66mm/px · 1 of 1 slices shown (2 of 2)]
[im 1/1]
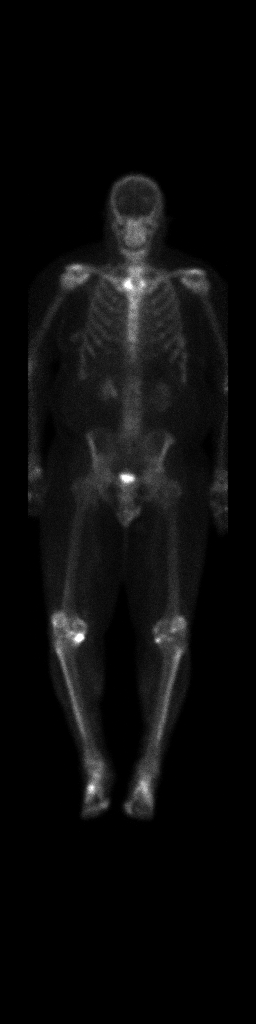

[2 of 2 positions shown; findings below may reference images not displayed]

FINDINGS: No foci of radiotracer within the axillary or appendicular skeleton
to suggest skeletal metastasis. Degenerate uptake noted in the LEFT
cervical spine and RIGHT thoracic spine. Degenerate uptake noted in
the medial compartment of the RIGHT knee.
IMPRESSION: No scintigraphic evidence skeletal metastasis.

## 2019-12-16 ENCOUNTER — Other Ambulatory Visit: Payer: Self-pay

## 2019-12-16 ENCOUNTER — Ambulatory Visit: Payer: Medicare Other | Attending: Internal Medicine

## 2019-12-16 DIAGNOSIS — Z23 Encounter for immunization: Secondary | ICD-10-CM

## 2019-12-16 NOTE — Progress Notes (Signed)
   Covid-19 Vaccination Clinic  Name:  Ian Dudley    MRN: [id number] DOB: September 06, 1941  12/16/2019  Ian Dudley was observed post Covid-19 immunization for 15 minutes without incidence. He was provided with Vaccine Information Sheet and instruction to access the V-Safe system.   Ian Dudley was instructed to call 911 with any severe reactions post vaccine: Marland Kitchen Difficulty breathing  . Swelling of your face and throat  . A fast heartbeat  . A bad rash all over your body  . Dizziness and weakness    Immunizations Administered    Name Date Dose VIS Date Route   Pfizer COVID-19 Vaccine 12/16/2019 11:27 AM 0.3 mL 11/14/2019 Intramuscular   Manufacturer: [place]   Lot: [id number]   [place]: [id number]

## 2020-01-05 ENCOUNTER — Ambulatory Visit: Payer: Medicare Other | Attending: Internal Medicine

## 2020-01-05 DIAGNOSIS — Z23 Encounter for immunization: Secondary | ICD-10-CM | POA: Insufficient documentation

## 2020-01-05 NOTE — Progress Notes (Signed)
   Covid-19 Vaccination Clinic  Name:  Ian Dudley    MRN: [id number] DOB: February 03, 1941  01/05/2020  Ian Dudley was observed post Covid-19 immunization for 15 minutes without incidence. He was provided with Vaccine Information Sheet and instruction to access the V-Safe system.   Ian Dudley was instructed to call 911 with any severe reactions post vaccine: Marland Kitchen Difficulty breathing  . Swelling of your face and throat  . A fast heartbeat  . A bad rash all over your body  . Dizziness and weakness    Immunizations Administered    Name Date Dose VIS Date Route   Pfizer COVID-19 Vaccine 01/05/2020  9:47 AM 0.3 mL 11/14/2019 Intramuscular   Manufacturer: [place]   Lot: [id number]   [place]: [id number]

## 2020-09-16 ENCOUNTER — Ambulatory Visit: Payer: Medicare Other | Attending: Internal Medicine

## 2020-09-16 DIAGNOSIS — Z23 Encounter for immunization: Secondary | ICD-10-CM

## 2020-09-16 NOTE — Progress Notes (Signed)
   Covid-19 Vaccination Clinic  Name:  Ian Dudley    MRN: [id number] DOB: 02/27/1941  09/16/2020  Ian Dudley was observed post Covid-19 immunization for 15 minutes without incident. He was provided with Vaccine Information Sheet and instruction to access the V-Safe system.   Ian Dudley was instructed to call 911 with any severe reactions post vaccine: Marland Kitchen Difficulty breathing  . Swelling of face and throat  . A fast heartbeat  . A bad rash all over body  . Dizziness and weakness

## 2021-01-05 ENCOUNTER — Other Ambulatory Visit (HOSPITAL_COMMUNITY): Payer: Self-pay | Admitting: Urology

## 2021-01-05 DIAGNOSIS — C61 Malignant neoplasm of prostate: Secondary | ICD-10-CM

## 2021-01-07 ENCOUNTER — Ambulatory Visit (HOSPITAL_COMMUNITY)
Admission: RE | Admit: 2021-01-07 | Discharge: 2021-01-07 | Disposition: A | Discharge status: Home / Self Care | Payer: Medicare Other | Source: Ambulatory Visit | Attending: Urology | Admitting: Urology

## 2021-01-07 ENCOUNTER — Other Ambulatory Visit: Payer: Self-pay

## 2021-01-07 DIAGNOSIS — C61 Malignant neoplasm of prostate: Secondary | ICD-10-CM | POA: Diagnosis not present

## 2021-11-09 DIAGNOSIS — E113291 Type 2 diabetes mellitus with mild nonproliferative diabetic retinopathy without macular edema, right eye: Secondary | ICD-10-CM | POA: Diagnosis not present

## 2021-11-09 DIAGNOSIS — H40033 Anatomical narrow angle, bilateral: Secondary | ICD-10-CM | POA: Diagnosis not present

## 2021-11-09 DIAGNOSIS — H2513 Age-related nuclear cataract, bilateral: Secondary | ICD-10-CM | POA: Diagnosis not present

## 2021-11-09 DIAGNOSIS — H40023 Open angle with borderline findings, high risk, bilateral: Secondary | ICD-10-CM | POA: Diagnosis not present

## 2021-11-15 DIAGNOSIS — C61 Malignant neoplasm of prostate: Secondary | ICD-10-CM | POA: Diagnosis not present

## 2021-11-22 DIAGNOSIS — N183 Chronic kidney disease, stage 3 unspecified: Secondary | ICD-10-CM | POA: Diagnosis not present

## 2021-11-22 DIAGNOSIS — C61 Malignant neoplasm of prostate: Secondary | ICD-10-CM | POA: Diagnosis not present

## 2022-03-02 DIAGNOSIS — C61 Malignant neoplasm of prostate: Secondary | ICD-10-CM | POA: Diagnosis not present

## 2022-03-09 DIAGNOSIS — C61 Malignant neoplasm of prostate: Secondary | ICD-10-CM | POA: Diagnosis not present

## 2022-03-09 DIAGNOSIS — N390 Urinary tract infection, site not specified: Secondary | ICD-10-CM | POA: Diagnosis not present

## 2022-03-20 DIAGNOSIS — M1990 Unspecified osteoarthritis, unspecified site: Secondary | ICD-10-CM | POA: Diagnosis not present

## 2022-03-20 DIAGNOSIS — R109 Unspecified abdominal pain: Secondary | ICD-10-CM | POA: Diagnosis not present

## 2022-03-20 DIAGNOSIS — E119 Type 2 diabetes mellitus without complications: Secondary | ICD-10-CM | POA: Diagnosis not present

## 2022-03-20 DIAGNOSIS — M791 Myalgia, unspecified site: Secondary | ICD-10-CM | POA: Diagnosis not present

## 2022-03-20 DIAGNOSIS — I119 Hypertensive heart disease without heart failure: Secondary | ICD-10-CM | POA: Diagnosis not present

## 2022-03-20 DIAGNOSIS — E7800 Pure hypercholesterolemia, unspecified: Secondary | ICD-10-CM | POA: Diagnosis not present

## 2022-03-30 ENCOUNTER — Other Ambulatory Visit (HOSPITAL_COMMUNITY): Payer: Self-pay | Admitting: Pulmonary Disease

## 2022-03-30 ENCOUNTER — Other Ambulatory Visit: Payer: Self-pay | Admitting: Pulmonary Disease

## 2022-03-30 DIAGNOSIS — R0609 Other forms of dyspnea: Secondary | ICD-10-CM

## 2022-03-30 DIAGNOSIS — N1832 Chronic kidney disease, stage 3b: Secondary | ICD-10-CM

## 2022-03-31 ENCOUNTER — Ambulatory Visit (HOSPITAL_COMMUNITY)
Admission: RE | Admit: 2022-03-31 | Discharge: 2022-03-31 | Disposition: A | Discharge status: Home / Self Care | Payer: Medicare Other | Source: Ambulatory Visit | Attending: Pulmonary Disease | Admitting: Pulmonary Disease

## 2022-03-31 DIAGNOSIS — R0609 Other forms of dyspnea: Secondary | ICD-10-CM

## 2022-03-31 DIAGNOSIS — N1832 Chronic kidney disease, stage 3b: Secondary | ICD-10-CM | POA: Diagnosis not present

## 2022-03-31 DIAGNOSIS — R0602 Shortness of breath: Secondary | ICD-10-CM | POA: Diagnosis not present

## 2022-03-31 DIAGNOSIS — N189 Chronic kidney disease, unspecified: Secondary | ICD-10-CM | POA: Diagnosis not present

## 2022-04-04 NOTE — Progress Notes (Signed)
?Cardiology Office Note:   ? ?Date:  04/05/2022  ? ?ID:  WILL HEINKEL, DOB 20-Jul-1941, MRN [id number] ? ?PCP:  Vincente Liberty, MD  ?Cardiologist:  Sinclair Grooms, MD  ? ?Referring MD: Vincente Liberty, MD  ? ?Chief Complaint  ?Patient presents with  ? Advice Only  ?  Shortness of breath per physician ?New first-degree AV block ?History of lower extremity swelling  ? ? ?History of Present Illness:   ? ?THANOS COUSINEAU is a 81 y.o. male with a hx of DM II, Prostate CA., primary hypertension, and an abnormal ECG. Referred for dyspnea on exertion. ? ?To hear the patient telemetry, he does not feel he is having any cardiac problem.  Had a recent chest x-ray that was unremarkable.  He is referred because of first-degree AV block on EKG and shortness of breath.  The patient denies orthopnea.  States he does not move around as much as he used to because of severe right knee arthritis.  He does get somewhat winded with physical activity however he attributes that to deconditioning. ? ?He denies chest pain.  Lasix has been started about a year ago because of lower extremity swelling.  When the legs were swollen he did not have orthopnea or dyspnea on exertion that he admits to. ? ?Past Medical History:  ?Diagnosis Date  ? Abdominal pain   ? AV block   ? Chronic kidney disease (CKD), active medical management without dialysis, stage 3 (moderate) (HCC)   ? Diabetes mellitus type I ([place])   ? DM (diabetes mellitus) ([place])   ? Elevated prostate specific antigen (PSA)   ? GERD (gastroesophageal reflux disease)   ? Hypertension, accelerated with heart disease, without CHF   ? Insect bite   ? Localized swelling, mass and lump, unspecified lower limb   ? Myalgia   ? Osteoarthritis   ? Other forms of dyspnea   ? Other long term (current) drug therapy   ? Pain in right knee   ? Personal history of malignant neoplasm of prostate   ? Prostate cancer ([place])   ? Pure hypercholesterolemia   ? Secondary cataract   ? ? ?Past  Surgical History:  ?Procedure Laterality Date  ? PROSTATECTOMY    ? TONSILLECTOMY    ? ? ?Current Medications: ?Current Meds  ?Medication Sig  ? bicalutamide (CASODEX) 50 MG tablet Take 50 mg by mouth daily.  ? furosemide (LASIX) 20 MG tablet Take 20 mg by mouth.  ? glipiZIDE (GLUCOTROL) 10 MG tablet Take 10 mg by mouth daily before breakfast.  ? lisinopril-hydrochlorothiazide (PRINZIDE,ZESTORETIC) 10-12.5 MG tablet Take 1 tablet by mouth daily. THIS IS THE LAST AUTHORIZED REFILL FOR THIS MEDICATION.  Please have future refills authorized by PCP  ? metFORMIN (GLUCOPHAGE) 500 MG tablet Take 500 mg by mouth every other day.  ? omeprazole (PRILOSEC) 20 MG capsule Take 20 mg by mouth daily.  ? pioglitazone (ACTOS) 30 MG tablet Take 30 mg by mouth daily.  ? simvastatin (ZOCOR) 10 MG tablet Take 10 mg by mouth daily.  ?  ? ?Allergies:   Patient has no known allergies.  ? ?Social History  ? ?Socioeconomic History  ? Marital status: Married  ?  Spouse name: Not on file  ? Number of children: Not on file  ? Years of education: Not on file  ? Highest education level: Not on file  ?Occupational History  ? Not on file  ?Tobacco Use  ? Smoking status: Former  ?  Smokeless tobacco: Never  ?Substance and Sexual Activity  ? Alcohol use: No  ?  Alcohol/week: 0.0 standard drinks  ? Drug use: No  ? Sexual activity: Not on file  ?Other Topics Concern  ? Not on file  ?Social History Narrative  ? Not on file  ? ?Social Determinants of Health  ? ?Financial Resource Strain: Not on file  ?Food Insecurity: Not on file  ?Transportation Needs: Not on file  ?Physical Activity: Not on file  ?Stress: Not on file  ?Social Connections: Not on file  ?  ? ?Family History: ?The patient's family history includes Breast cancer in his sister; Colon cancer in his brother and sister; Diabetes in his mother; Healthy in his brother, brother, and sister; Hypertension in his mother; Kidney failure in his mother; Lupus in his sister; Prostate cancer in his  brother. ? ?ROS:   ?Please see the history of present illness.    ?Arthritis in right knee prevents the kind of physical activity that he prefers.  No orthopnea, PND, or significant snoring.  All other systems reviewed and are negative. ? ?EKGs/Labs/Other Studies Reviewed:   ? ?The following studies were reviewed today: ?NUCLEAR STRESS TEST 2016: ?Study Highlights ? ?Nuclear stress EF: 71%. ?There was no ST segment deviation noted during stress. ?This is a low risk study. ?The left ventricular ejection fraction is hyperdynamic (>65%). ?  ?No evidence of ischemia or infarction. No change in nonspecific ST-T changes with lexiscan stress. LV systolic function is vigorous EF 71% with no wall motion abnormalities. ? ?EKG:  EKG normal sinus rhythm, first-degree AV block with PR 226 ms, nonspecific inferior lead T wave flattening.  Biatrial abnormality.  1 PVC is noted.  When compared to the prior tracing performed 09/30/2015, the PR interval is now prolonged. ? ?Recent Labs: ?No results found for requested labs within last 8760 hours.  ?Recent Lipid Panel ?No results found for: CHOL, TRIG, HDL, CHOLHDL, VLDL, LDLCALC, LDLDIRECT ? ?Physical Exam:   ? ?VS:  BP (!) 118/54   Pulse 75   Ht 5' 7.5" (1.715 m)   Wt 225 lb 12.8 oz (102.4 kg)   SpO2 97%   BMI 34.84 kg/m?    ? ?Wt Readings from Last 3 Encounters:  ?04/05/22 225 lb 12.8 oz (102.4 kg)  ?10/13/15 218 lb (98.9 kg)  ?09/30/15 218 lb 1.9 oz (98.9 kg)  ?  ? ?GEN: Overweight. No acute distress ?HEENT: Normal ?NECK: No JVD. ?LYMPHATICS: No lymphadenopathy ?CARDIAC: No murmur. RRR S4 but no S3 gallop, or edema. ?VASCULAR:  Normal Pulses. No bruits. ?RESPIRATORY:  Clear to auscultation without rales, wheezing or rhonchi  ?ABDOMEN: Soft, non-tender, non-distended, No pulsatile mass, ?MUSCULOSKELETAL: No deformity  ?SKIN: Warm and dry ?NEUROLOGIC:  Alert and oriented x 3 ?PSYCHIATRIC:  Normal affect  ? ?ASSESSMENT:   ? ?1. Chronic diastolic (congestive) heart failure (HCC)    ?2. Essential hypertension   ?3. Abnormal ECG   ?4. Type 2 diabetes mellitus with stage 2 chronic kidney disease, with long-term current use of insulin ([place])   ? ?PLAN:   ? ?In order of problems listed above: ? ?This is a possible diagnosis and could explain lower extremity swelling.  2D Doppler echocardiogram to assess for systolic LV and diastolic LV function.  Rule out pulmonary hypertension which could explain lower extremity edema.  If there is a signal towards significant diastolic dysfunction, consider adding SGLT2. ?Blood pressure is 140/62 which is appropriate for his current age.  Initial recording of 118/54  was done with a large cuff and is probably inaccurate. ?First-degree AV block and nonspecific T wave abnormality not of significant concern. ?Being managed by Dr. Katherine Roan. ? ? ?Medication Adjustments/Labs and Tests Ordered: ?Current medicines are reviewed at length with the patient today.  Concerns regarding medicines are outlined above.  ?Orders Placed This Encounter  ?Procedures  ? EKG 12-Lead  ? ECHOCARDIOGRAM COMPLETE  ? ?No orders of the defined types were placed in this encounter. ? ? ?Patient Instructions  ?Medication Instructions:  ?Your physician recommends that you continue on your current medications as directed. Please refer to the Current Medication list given to you today. ? ?*If you need a refill on your cardiac medications before your next appointment, please call your pharmacy* ? ?Lab Work: ?NONE ? ?Testing/Procedures: ?Your physician has requested that you have an echocardiogram. Echocardiography is a painless test that uses sound waves to create images of your heart. It provides your doctor with information about the size and shape of your heart and how well your heart?s chambers and valves are working. This procedure takes approximately one hour. There are no restrictions for this procedure. ? ?Follow-Up: ?At [facility], you and your health needs are our priority.  As part  of our continuing mission to provide you with exceptional heart care, we have created designated Provider Care Teams.  These Care Teams include your primary Cardiologist (physician) and Advanced Practice Providers (AP

## 2022-04-05 ENCOUNTER — Ambulatory Visit: Payer: Medicare Other | Admitting: Interventional Cardiology

## 2022-04-05 VITALS — BP 118/54 | HR 75 | Ht 67.5 in | Wt 225.8 lb

## 2022-04-05 DIAGNOSIS — R9431 Abnormal electrocardiogram [ECG] [EKG]: Secondary | ICD-10-CM

## 2022-04-05 DIAGNOSIS — E1122 Type 2 diabetes mellitus with diabetic chronic kidney disease: Secondary | ICD-10-CM

## 2022-04-05 DIAGNOSIS — Z794 Long term (current) use of insulin: Secondary | ICD-10-CM

## 2022-04-05 DIAGNOSIS — N182 Chronic kidney disease, stage 2 (mild): Secondary | ICD-10-CM

## 2022-04-05 DIAGNOSIS — I10 Essential (primary) hypertension: Secondary | ICD-10-CM | POA: Diagnosis not present

## 2022-04-05 DIAGNOSIS — I5032 Chronic diastolic (congestive) heart failure: Secondary | ICD-10-CM

## 2022-04-05 NOTE — Patient Instructions (Signed)
Medication Instructions:  ?Your physician recommends that you continue on your current medications as directed. Please refer to the Current Medication list given to you today. ? ?*If you need a refill on your cardiac medications before your next appointment, please call your pharmacy* ? ?Lab Work: ?NONE ? ?Testing/Procedures: ?Your physician has requested that you have an echocardiogram. Echocardiography is a painless test that uses sound waves to create images of your heart. It provides your doctor with information about the size and shape of your heart and how well your heart?s chambers and valves are working. This procedure takes approximately one hour. There are no restrictions for this procedure. ? ?Follow-Up: ?At [facility], you and your health needs are our priority.  As part of our continuing mission to provide you with exceptional heart care, we have created designated Provider Care Teams.  These Care Teams include your primary Cardiologist (physician) and Advanced Practice Providers (APPs -  Physician Assistants and Nurse Practitioners) who all work together to provide you with the care you need, when you need it. ? ?Your next appointment:   ?As needed pending results of echo ? ?The format for your next appointment:   ?In Person ? ?Provider:   ?Sinclair Grooms, MD { ? ? ?Important Information About Sugar ? ? ? ? ?  ?

## 2022-04-26 ENCOUNTER — Ambulatory Visit (HOSPITAL_COMMUNITY): Payer: Medicare Other | Attending: Cardiovascular Disease

## 2022-04-26 DIAGNOSIS — I10 Essential (primary) hypertension: Secondary | ICD-10-CM

## 2022-04-26 LAB — ECHOCARDIOGRAM COMPLETE
Area-P 1/2: 3.21 cm2
S' Lateral: 2.8 cm

## 2022-05-15 DIAGNOSIS — H40033 Anatomical narrow angle, bilateral: Secondary | ICD-10-CM | POA: Diagnosis not present

## 2022-05-15 DIAGNOSIS — H40023 Open angle with borderline findings, high risk, bilateral: Secondary | ICD-10-CM | POA: Diagnosis not present

## 2022-06-07 DIAGNOSIS — C61 Malignant neoplasm of prostate: Secondary | ICD-10-CM | POA: Diagnosis not present

## 2022-06-15 DIAGNOSIS — N390 Urinary tract infection, site not specified: Secondary | ICD-10-CM | POA: Diagnosis not present

## 2022-06-15 DIAGNOSIS — C61 Malignant neoplasm of prostate: Secondary | ICD-10-CM | POA: Diagnosis not present

## 2022-09-12 DIAGNOSIS — M1990 Unspecified osteoarthritis, unspecified site: Secondary | ICD-10-CM | POA: Diagnosis not present

## 2022-09-12 DIAGNOSIS — E119 Type 2 diabetes mellitus without complications: Secondary | ICD-10-CM | POA: Diagnosis not present

## 2022-09-12 DIAGNOSIS — M899 Disorder of bone, unspecified: Secondary | ICD-10-CM | POA: Diagnosis not present

## 2022-09-12 DIAGNOSIS — R109 Unspecified abdominal pain: Secondary | ICD-10-CM | POA: Diagnosis not present

## 2022-09-12 DIAGNOSIS — E7800 Pure hypercholesterolemia, unspecified: Secondary | ICD-10-CM | POA: Diagnosis not present

## 2022-09-12 DIAGNOSIS — I119 Hypertensive heart disease without heart failure: Secondary | ICD-10-CM | POA: Diagnosis not present

## 2022-09-13 ENCOUNTER — Other Ambulatory Visit (HOSPITAL_COMMUNITY): Payer: Self-pay | Admitting: Pulmonary Disease

## 2022-09-13 ENCOUNTER — Ambulatory Visit (HOSPITAL_COMMUNITY)
Admission: RE | Admit: 2022-09-13 | Discharge: 2022-09-13 | Disposition: A | Discharge status: Home / Self Care | Payer: Medicare Other | Source: Ambulatory Visit | Attending: Pulmonary Disease | Admitting: Pulmonary Disease

## 2022-09-13 DIAGNOSIS — M4326 Fusion of spine, lumbar region: Secondary | ICD-10-CM | POA: Insufficient documentation

## 2022-09-13 DIAGNOSIS — M545 Low back pain, unspecified: Secondary | ICD-10-CM | POA: Diagnosis not present

## 2022-09-13 DIAGNOSIS — M25561 Pain in right knee: Secondary | ICD-10-CM | POA: Diagnosis not present

## 2022-09-13 DIAGNOSIS — M25571 Pain in right ankle and joints of right foot: Secondary | ICD-10-CM | POA: Diagnosis not present

## 2022-09-13 DIAGNOSIS — I700 Atherosclerosis of aorta: Secondary | ICD-10-CM | POA: Diagnosis not present

## 2022-09-14 DIAGNOSIS — C61 Malignant neoplasm of prostate: Secondary | ICD-10-CM | POA: Diagnosis not present

## 2022-09-21 DIAGNOSIS — C61 Malignant neoplasm of prostate: Secondary | ICD-10-CM | POA: Diagnosis not present

## 2022-10-16 IMAGING — US US RENAL
1 series · 15 of 25 positions shown · non-contrast
Comparison: None.

CLINICAL DATA: Chronic renal disease.

EXAM:
RENAL / URINARY TRACT ULTRASOUND COMPLETE

[Series 1: us renal mc & wl · 15 of 51 slices shown]
[im 1/51]
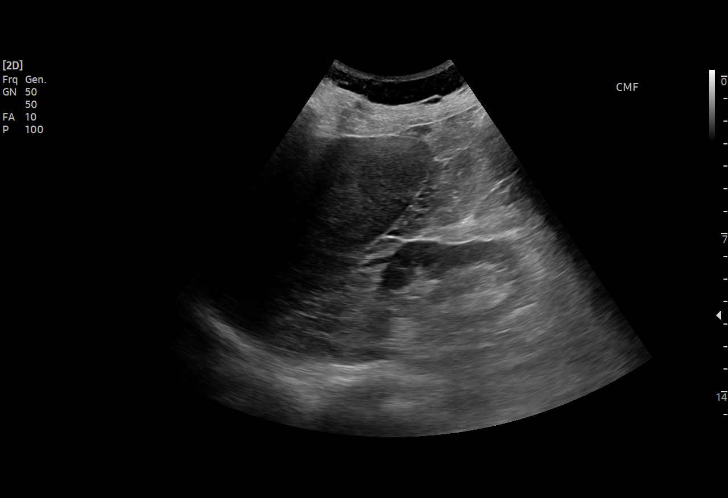
[im 5/51]
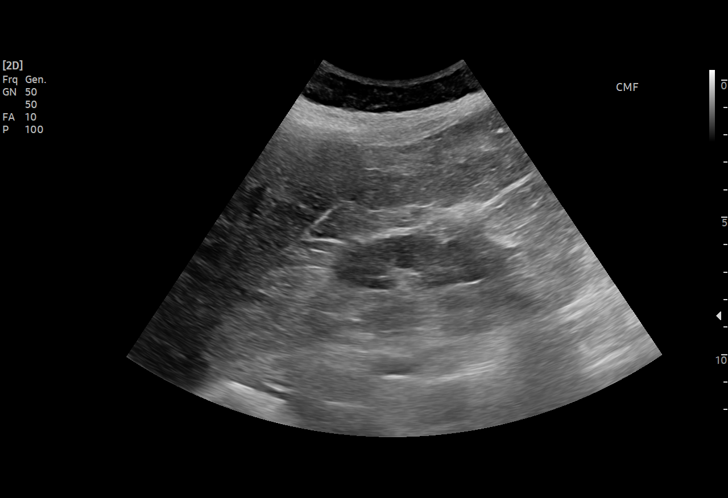
[im 9/51]
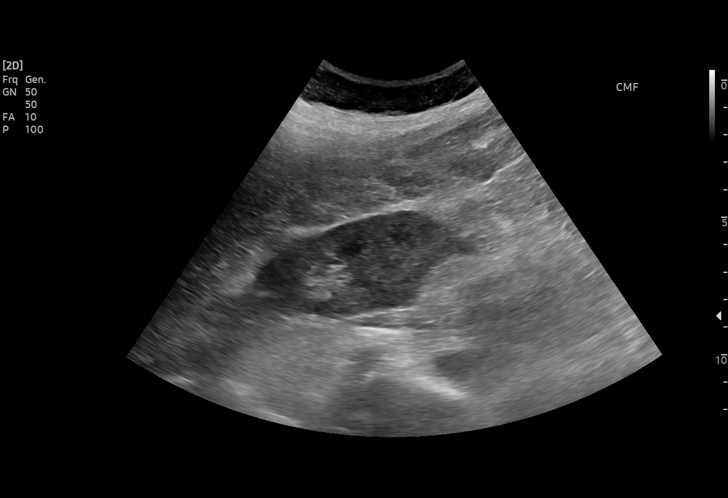
[im 11/51]
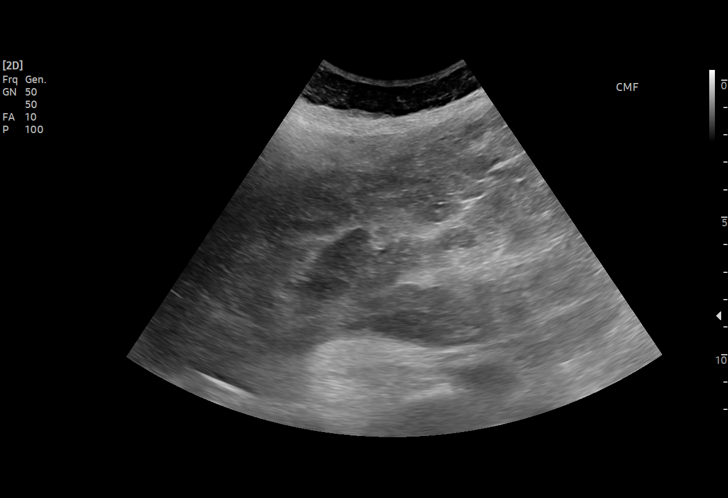
[im 15/51]
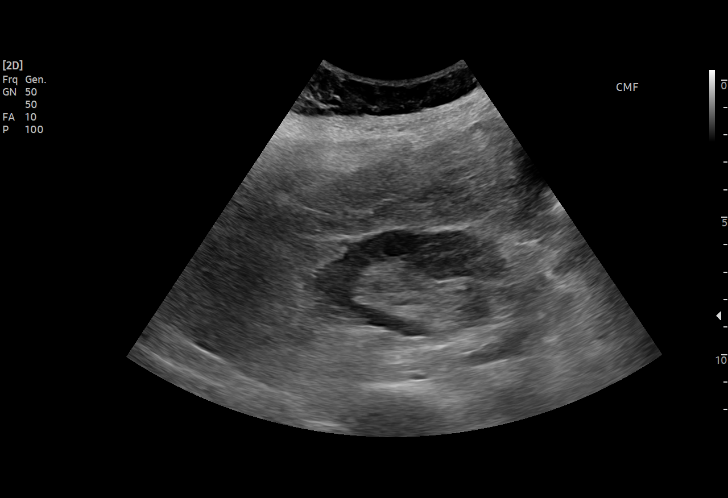
[im 19/51]
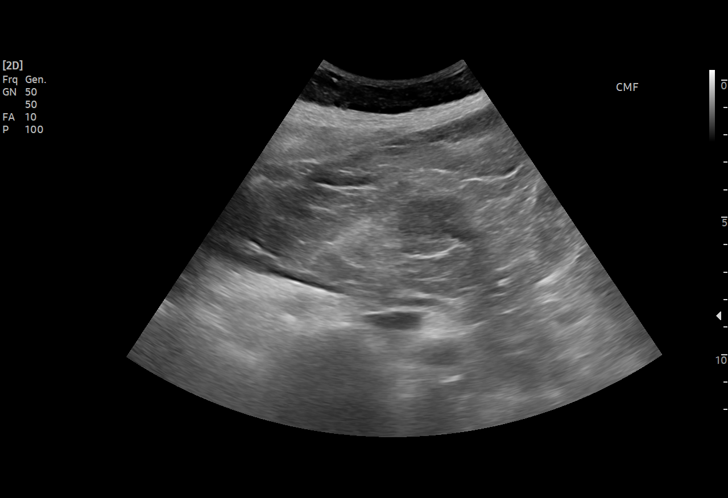
[im 21/51]
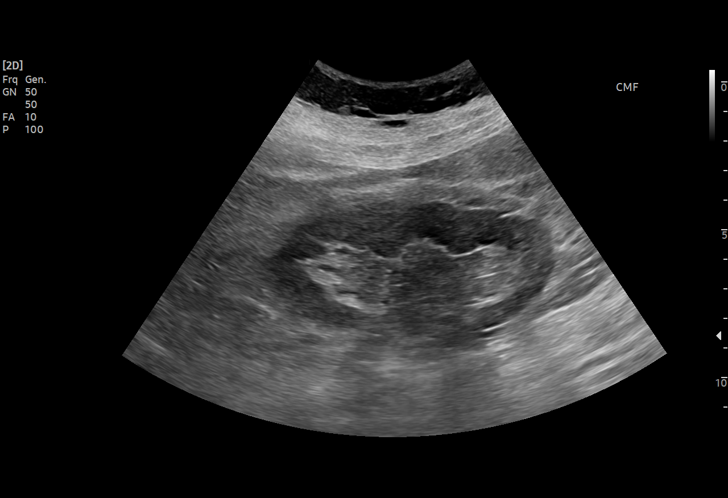
[im 26/51]
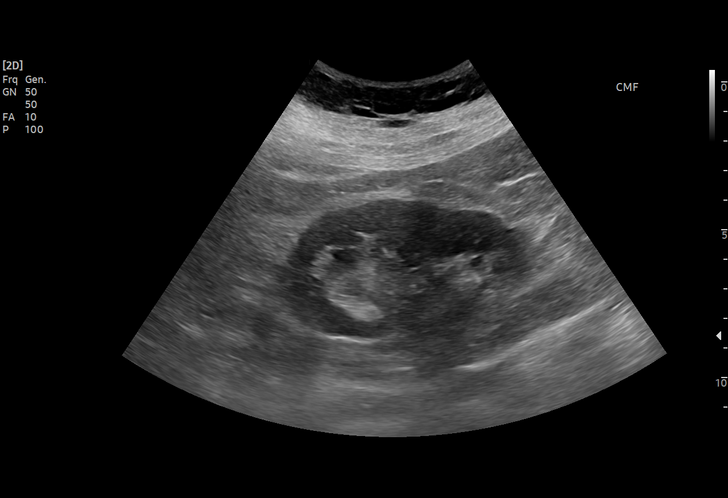
[im 30/51]
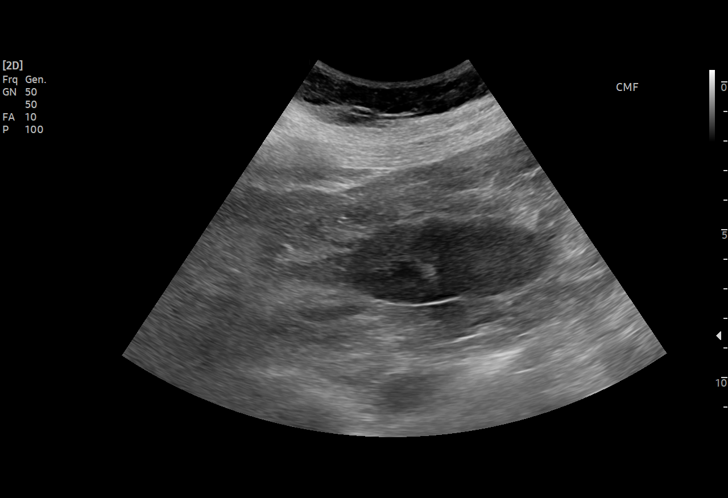
[im 32/51]
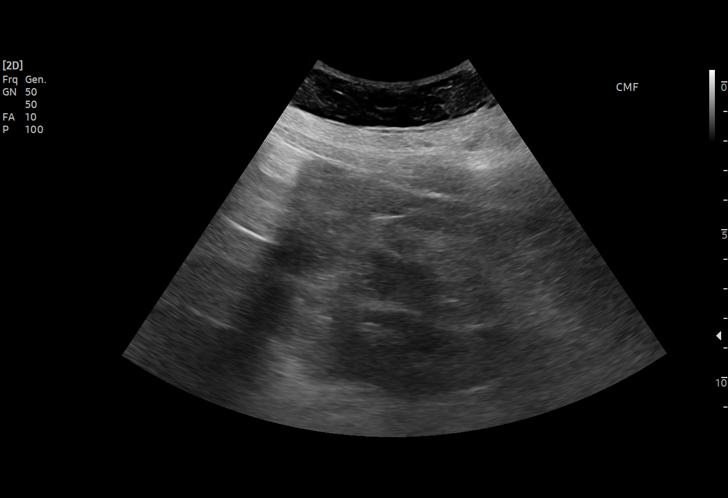
[im 36/51]
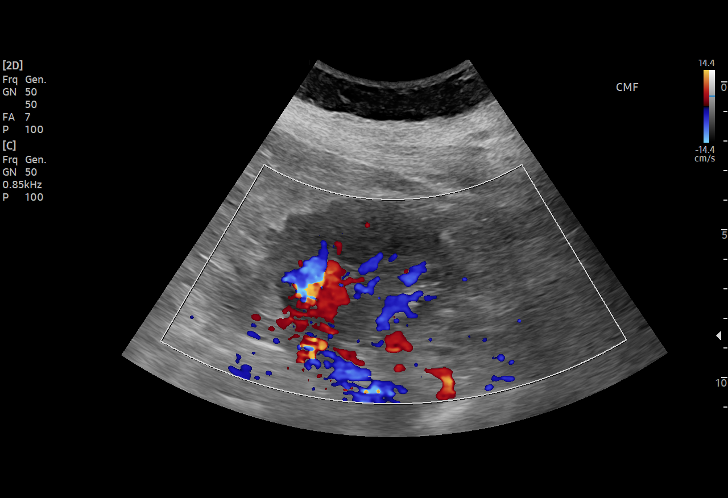
[im 40/51]
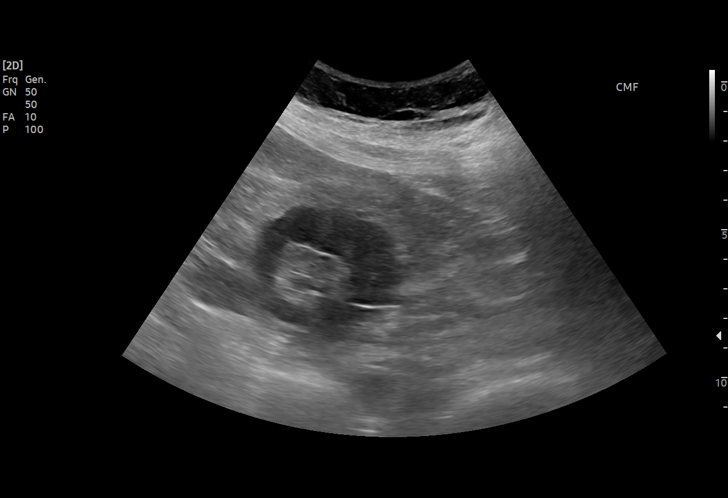
[im 42/51]
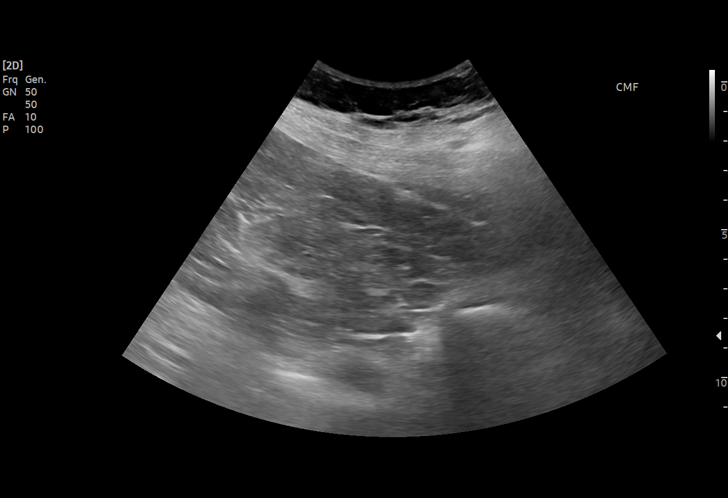
[im 46/51]
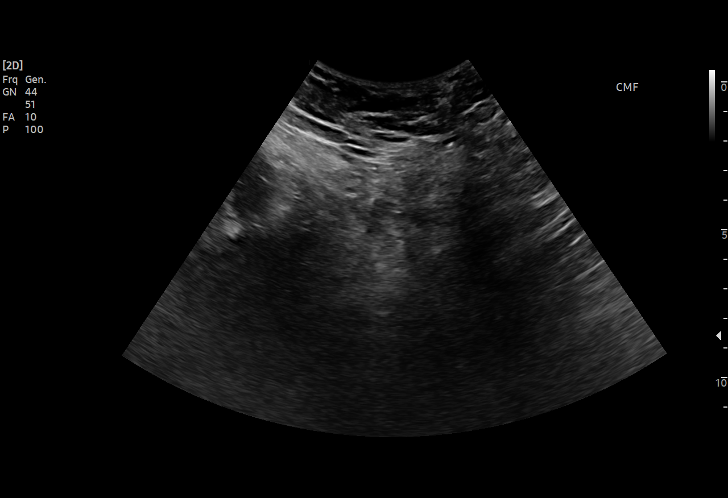
[im 51/51]
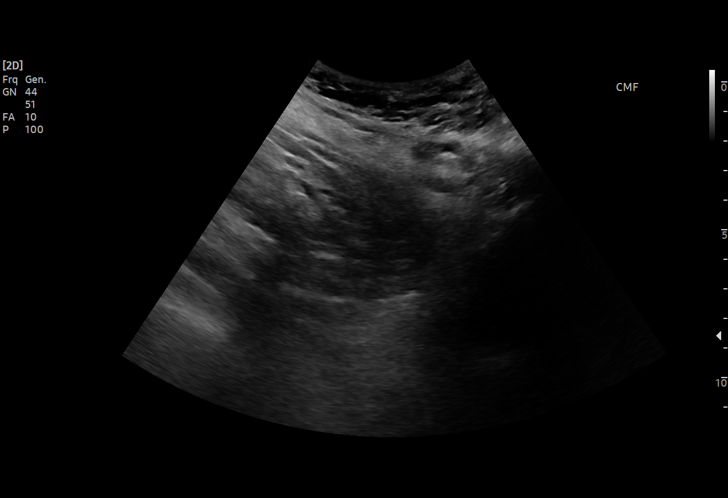

[15 of 25 positions shown; findings below may reference images not displayed]

FINDINGS: Right Kidney:

Renal measurements: 10.0 x 4.0 x 5.2 cm = volume: 109.4 mL.
Echogenicity within normal limits. No mass or hydronephrosis
visualized.

Left Kidney:

Renal measurements: 9.7 x 4.5 x 4.6 cm = volume: 105.4 mL.
Echogenicity within normal limits. No mass or hydronephrosis
visualized.

Bladder:

Appears normal for degree of bladder distention.

Other:

None.
IMPRESSION: No hydronephrosis.

## 2022-11-09 DIAGNOSIS — E119 Type 2 diabetes mellitus without complications: Secondary | ICD-10-CM | POA: Diagnosis not present

## 2022-11-09 DIAGNOSIS — R109 Unspecified abdominal pain: Secondary | ICD-10-CM | POA: Diagnosis not present

## 2022-11-09 DIAGNOSIS — M1990 Unspecified osteoarthritis, unspecified site: Secondary | ICD-10-CM | POA: Diagnosis not present

## 2022-11-09 DIAGNOSIS — I119 Hypertensive heart disease without heart failure: Secondary | ICD-10-CM | POA: Diagnosis not present

## 2022-11-11 ENCOUNTER — Telehealth (HOSPITAL_COMMUNITY): Payer: Self-pay | Admitting: Emergency Medicine

## 2022-11-11 ENCOUNTER — Emergency Department (HOSPITAL_COMMUNITY): Payer: Medicare Other

## 2022-11-11 ENCOUNTER — Emergency Department (HOSPITAL_COMMUNITY)
Admission: EM | Admit: 2022-11-11 | Discharge: 2022-11-11 | Disposition: A | Discharge status: Home / Self Care | Payer: Medicare Other | Attending: Emergency Medicine | Admitting: Emergency Medicine

## 2022-11-11 ENCOUNTER — Other Ambulatory Visit: Payer: Self-pay

## 2022-11-11 DIAGNOSIS — R590 Localized enlarged lymph nodes: Secondary | ICD-10-CM | POA: Diagnosis not present

## 2022-11-11 DIAGNOSIS — N189 Chronic kidney disease, unspecified: Secondary | ICD-10-CM | POA: Insufficient documentation

## 2022-11-11 DIAGNOSIS — S42292A Other displaced fracture of upper end of left humerus, initial encounter for closed fracture: Secondary | ICD-10-CM | POA: Insufficient documentation

## 2022-11-11 DIAGNOSIS — M25522 Pain in left elbow: Secondary | ICD-10-CM | POA: Diagnosis not present

## 2022-11-11 DIAGNOSIS — W0110XA Fall on same level from slipping, tripping and stumbling with subsequent striking against unspecified object, initial encounter: Secondary | ICD-10-CM | POA: Diagnosis not present

## 2022-11-11 DIAGNOSIS — Y9222 Religious institution as the place of occurrence of the external cause: Secondary | ICD-10-CM | POA: Insufficient documentation

## 2022-11-11 DIAGNOSIS — E1122 Type 2 diabetes mellitus with diabetic chronic kidney disease: Secondary | ICD-10-CM | POA: Insufficient documentation

## 2022-11-11 DIAGNOSIS — W19XXXA Unspecified fall, initial encounter: Secondary | ICD-10-CM | POA: Diagnosis not present

## 2022-11-11 DIAGNOSIS — M25512 Pain in left shoulder: Secondary | ICD-10-CM | POA: Diagnosis not present

## 2022-11-11 DIAGNOSIS — S42252A Displaced fracture of greater tuberosity of left humerus, initial encounter for closed fracture: Secondary | ICD-10-CM | POA: Diagnosis not present

## 2022-11-11 DIAGNOSIS — S42202A Unspecified fracture of upper end of left humerus, initial encounter for closed fracture: Secondary | ICD-10-CM | POA: Diagnosis not present

## 2022-11-11 DIAGNOSIS — Z7984 Long term (current) use of oral hypoglycemic drugs: Secondary | ICD-10-CM | POA: Diagnosis not present

## 2022-11-11 DIAGNOSIS — M79602 Pain in left arm: Secondary | ICD-10-CM | POA: Diagnosis present

## 2022-11-11 DIAGNOSIS — S42342A Displaced spiral fracture of shaft of humerus, left arm, initial encounter for closed fracture: Secondary | ICD-10-CM | POA: Diagnosis not present

## 2022-11-11 DIAGNOSIS — S4990XA Unspecified injury of shoulder and upper arm, unspecified arm, initial encounter: Secondary | ICD-10-CM | POA: Diagnosis not present

## 2022-11-11 MED ORDER — FENTANYL CITRATE PF 50 MCG/ML IJ SOSY
50.0000 ug | PREFILLED_SYRINGE | Freq: Once | INTRAMUSCULAR | Status: AC
  Administered 2022-11-11: 50 ug via INTRAVENOUS
  Filled 2022-11-11: qty 1

## 2022-11-11 MED ORDER — HYDROCODONE-ACETAMINOPHEN 5-325 MG PO TABS: 1.0000 | ORAL_TABLET | Freq: Three times a day (TID) | ORAL | 0 refills | Status: AC | PRN

## 2022-11-11 MED ORDER — HYDROCODONE-ACETAMINOPHEN 5-325 MG PO TABS
1.0000 | ORAL_TABLET | Freq: Three times a day (TID) | ORAL | 0 refills | Status: DC | PRN
Start: 2022-11-11 — End: 2022-11-11

## 2022-11-11 MED ORDER — FENTANYL CITRATE PF 50 MCG/ML IJ SOSY: 50.0000 ug | PREFILLED_SYRINGE | INTRAMUSCULAR | Status: DC | PRN

## 2022-11-11 MED ORDER — HYDROMORPHONE HCL 1 MG/ML IJ SOLN
1.0000 mg | Freq: Once | INTRAMUSCULAR | Status: AC
  Administered 2022-11-11: 1 mg via INTRAVENOUS
  Filled 2022-11-11: qty 1

## 2022-11-11 NOTE — ED Provider Notes (Signed)
[place] DEPT Provider Note   CSN: [id number] Arrival date & time: 11/11/22  1403     History  Chief Complaint  Patient presents with   Shoulder Injury    L shoulder     Ian Dudley is a 81 y.o. male.  HPI     81 year old male comes in with chief complaint of left shoulder injury. Patient has history of diabetes, chronic kidney disease.  He was at church, and tripped over a cable and as result fell over.  He started having left-sided arm pain.  He is sure that he did not hit his head and denies any headaches, neck pain, one-sided weakness, one-sided numbness, vision change, nausea or vomiting.  He denies any loss of consciousness or amnesia.  Patient has no chest pain, shortness of breath and has ambulated after the injury.  He is not on any blood thinners.  Home Medications Prior to Admission medications   Medication Sig Start Date End Date Taking? Authorizing Provider  HYDROcodone-acetaminophen (NORCO/VICODIN) 5-325 MG tablet Take 1 tablet by mouth every 8 (eight) hours as needed for up to 3 days for severe pain. 11/11/22 11/14/22 Yes Varney Biles, MD  bicalutamide (CASODEX) 50 MG tablet Take 50 mg by mouth daily. 03/09/22   [provider]  furosemide (LASIX) 20 MG tablet Take 20 mg by mouth.    [provider]  glipiZIDE (GLUCOTROL) 10 MG tablet Take 10 mg by mouth daily before breakfast.    [provider]  lisinopril-hydrochlorothiazide (PRINZIDE,ZESTORETIC) 10-12.5 MG tablet Take 1 tablet by mouth daily. THIS IS THE LAST AUTHORIZED REFILL FOR THIS MEDICATION.  Please have future refills authorized by PCP 03/07/17   Belva Crome, MD  metFORMIN (GLUCOPHAGE) 500 MG tablet Take 500 mg by mouth every other day.    [provider]  omeprazole (PRILOSEC) 20 MG capsule Take 20 mg by mouth daily.    [provider]  pioglitazone (ACTOS) 30 MG tablet Take 30 mg by mouth daily.    [provider]  simvastatin (ZOCOR) 10 MG tablet Take 10 mg by mouth daily.    [provider]      Allergies    Patient has no known allergies.    Review of Systems   Review of Systems  Physical Exam Updated Vital Signs BP (!) 131/51 (BP Location: Right Arm)   Pulse 64   Temp 97.7 F (36.5 C) (Oral)   Resp 20   Ht 5' 7.5" (1.715 m)   Wt 99.8 kg   SpO2 98%   BMI 33.95 kg/m  Physical Exam Vitals and nursing note reviewed.  Constitutional:      Appearance: He is well-developed.  HENT:     Head: Atraumatic.  Neck:     Comments: No midline c-spine tenderness, pt able to turn head to 45 degrees bilaterally without any pain and able to flex neck to the chest and extend without any pain or neurologic symptoms.  Cardiovascular:     Rate and Rhythm: Normal rate.  Pulmonary:     Effort: Pulmonary effort is normal.  Abdominal:     Tenderness: There is no abdominal tenderness.  Musculoskeletal:        General: Swelling and tenderness present.     Cervical back: Neck supple.     Comments: Patient has left-sided shoulder tenderness with palpation, positive gross swelling.  Skin:    General: Skin is warm.  Neurological:     Mental Status:  He is alert and oriented to person, place, and time.     ED Results / Procedures / Treatments   Labs (all labs ordered are listed, but only abnormal results are displayed) Labs Reviewed - No data to display  EKG None  Radiology DG Shoulder Left  Result Date: 11/11/2022 CLINICAL DATA:  Left humerus pain status post mechanical fall EXAM: LEFT SHOULDER - 2+ VIEW COMPARISON:  None Available. FINDINGS: Fracture: Spiral fracture of the proximal humeral diaphysis with apex medial angulation. The glenohumeral joint is maintained. Healing: None. Soft tissue: Normal. IMPRESSION: Spiral fracture of the proximal humeral diaphysis with apex medial angulation. Electronically Signed   By: Darrin Nipper M.D.   On: 11/11/2022 15:53   DG Elbow 2 Views  Left  Result Date: 11/11/2022 CLINICAL DATA:  Mechanical fall with left humeral pain EXAM: LEFT ELBOW - 2 VIEW COMPARISON:  Same day shoulder radiograph. FINDINGS: Suboptimal positioning of the left elbow. Within these limitations, no definite fracture or dislocation. Partially imaged proximal humeral fracture is better evaluated on same day shoulder radiograph. IMPRESSION: Suboptimal positioning of the left elbow. Within these limitations, no definite fracture or dislocation. Electronically Signed   By: Darrin Nipper M.D.   On: 11/11/2022 15:53    Procedures Procedures    Medications Ordered in ED Medications  fentaNYL (SUBLIMAZE) injection 50 mcg (has no administration in time range)  fentaNYL (SUBLIMAZE) injection 50 mcg (50 mcg Intravenous Given 11/11/22 1520)  HYDROmorphone (DILAUDID) injection 1 mg (1 mg Intravenous Given 11/11/22 1645)    ED Course/ Medical Decision Making/ A&P Clinical Course as of 11/11/22 1739  Sat Nov 11, 2022  1739 DG Shoulder Left X-ray of the left shoulder interpreted independently.  There is clear evidence of proximal humeral fracture with displacement.  I consulted orthopedic surgery.  They recommend that we place patient in coaptation splint with sling and have patient follow-up in their clinic on Tuesday, 8:30 AM.  This recommendation has been discussed with the patient. [AN]    Clinical Course User Index [AN] Varney Biles, MD                           Medical Decision Making 81 year old patient comes in with chief complaint of mechanical fall. He is history of diabetes, hypertension and hyperlipidemia.  On exam, patient uncomfortable due to left shoulder pain.  He also is noted to have tenderness over the left elbow.  Patient has no C-spine tenderness, denies any red flags suggesting elevated ICP.  Patient has declined head CT -understanding that despite not having any direct head trauma, he could possibly have a small bleed that could get worse.   Return precautions for subdural hematoma discussed.  Initial plan is to get x-ray of the left elbow and left shoulder.  Problems Addressed: Other closed displaced fracture of proximal end of left humerus, initial encounter: complicated acute illness or injury  Amount and/or Complexity of Data Reviewed Radiology: ordered.  Risk Prescription drug management. Elective major surgery with identified risk factors.   Final Clinical Impression(s) / ED Diagnoses Final diagnoses:  Other closed displaced fracture of proximal end of left humerus, initial encounter    Rx / DC Orders ED Discharge Orders          Ordered    HYDROcodone-acetaminophen (NORCO/VICODIN) 5-325 MG tablet  Every 8 hours PRN        11/11/22 1712  Varney Biles, MD 11/11/22 1740

## 2022-11-11 NOTE — ED Triage Notes (Signed)
Patient [place] by EMS from church after tripping over extension cord and injuring left shoulder. HX of HTN took AM BP meds.  100/40 60 28mg Fentanyl given by EMS

## 2022-11-11 NOTE — Discharge Instructions (Addendum)
The x-ray of your shoulder reveals that you have a displaced fracture of your humerus, right below the shoulder area.  You have been placed in a sling and splint that should provide some support and assistance.  Please follow-up with Dr. Griffin Basil at his clinic on Tuesday, 8:30 AM.

## 2022-11-11 NOTE — Progress Notes (Signed)
Orthopedic Tech Progress Note Patient Details:  Ian Dudley Jun 07, 1941 [id number]  Ortho Devices Type of Ortho Device: Coapt, Arm sling Ortho Device/Splint Location: LUE Ortho Device/Splint Interventions: Application   Post Interventions Patient Tolerated: Well  [name] Ahaana Rochette 11/11/2022, 6:30 PM

## 2022-11-14 ENCOUNTER — Encounter (HOSPITAL_COMMUNITY): Payer: Self-pay | Admitting: Orthopaedic Surgery

## 2022-11-14 ENCOUNTER — Other Ambulatory Visit: Payer: Self-pay

## 2022-11-14 DIAGNOSIS — S42352A Displaced comminuted fracture of shaft of humerus, left arm, initial encounter for closed fracture: Secondary | ICD-10-CM | POA: Diagnosis not present

## 2022-11-14 NOTE — Progress Notes (Signed)
Anesthesia Chart Review   Case: [id number] Date/Time: 11/15/22 0815   Procedure: INTRAMEDULLARY (IM) NAIL HUMERAL (Left)   Anesthesia type: General   Pre-op diagnosis: left humerus fracture   Location: [facility] ROOM 01 / [facility]   Surgeons: Hiram Gash, MD       DISCUSSION:81 y.o. former smoker with h/o HTN, CHF, CKD, DM, prostate cancer, left humerus fracture scheduled for above procedure 11/15/2022 with Dr. Ophelia Charter.   Pt last seen by cardiology 04/05/2022.  Echo 04/26/2022 with EF 60-65%.   Labs DOS, same day workup, not seen in PAT.  VS: Ht '5\' 7"'$  (1.702 m)   Wt 99.8 kg   BMI 34.46 kg/m   PROVIDERS: Vincente Liberty, MD is PCP    LABS:  labs DOS, same day workup.  (all labs ordered are listed, but only abnormal results are displayed)  Labs Reviewed - No data to display   IMAGES:   EKG:   CV: Echo 04/26/2022 1. Left ventricular ejection fraction, by estimation, is 60 to 65%. Left  ventricular ejection fraction by 3D volume is 64 %. The left ventricle has  normal function. The left ventricle has no regional wall motion  abnormalities. Left ventricular diastolic   parameters are consistent with Grade I diastolic dysfunction (impaired  relaxation).   2. Right ventricular systolic function is normal. The right ventricular  size is normal. Tricuspid regurgitation signal is inadequate for assessing  PA pressure.   3. The mitral valve is normal in structure. Trivial mitral valve  regurgitation. No evidence of mitral stenosis.   4. The aortic valve is normal in structure. Aortic valve regurgitation is  not visualized. No aortic stenosis is present.   5. The inferior vena cava is normal in size with greater than 50%  respiratory variability, suggesting right atrial pressure of 3 mmHg.   Myocardial Perfusion 10/13/2015 Nuclear stress EF: 71%. There was no ST segment deviation noted during stress. This is a low risk study. The left ventricular ejection fraction is  hyperdynamic (>65%).   No evidence of ischemia or infarction. No change in nonspecific ST-T changes with lexiscan stress. LV systolic function is vigorous EF 71% with no wall motion abnormalities. 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    acetaminophen (TYLENOL) 500 MG tablet   glipiZIDE (GLUCOTROL XL) 10 MG 24 hr tablet   HYDROcodone-acetaminophen (NORCO/VICODIN) 5-325 MG tablet   lisinopril-hydrochlorothiazide (PRINZIDE,ZESTORETIC) 10-12.5 MG tablet  metFORMIN (GLUCOPHAGE) 500 MG tablet   Omega-3 Fatty Acids (OMEGA 3 500 PO)   pioglitazone (ACTOS) 30 MG tablet   simvastatin (ZOCOR) 10 MG tablet   XTANDI 40 MG tablet   [facility] Ward, PA-C [facility] Pre-Surgical Testing [phone]

## 2022-11-14 NOTE — Progress Notes (Addendum)
For Short Stay: [place] appointment date: N/A  Bowel Prep reminder: N/A   For Anesthesia: PCP - Vincente Liberty, MD  Cardiologist - Belva Crome, MD last office note 04/05/22 in [facility]  Chest x-ray - 03/31/22 in [facility] EKG - 04/05/22 in [facility] Stress Test - greater than 2 years in [facility] ECHO - 04/26/22 in [facility] Cardiac Cath - N/A Pacemaker/ICD device last checked: N/A Pacemaker orders received: N/A Device Rep notified: N/A  Spinal Cord Stimulator: N/A  Sleep Study - N/A CPAP - N/A  Fasting Blood Sugar - 66-85 Checks Blood Sugar ___1-2__ times a week Date and result of last Hgb A1c-  Last dose of GLP1 agonist- N/A GLP1 instructions: N/A  Last dose of SGLT-2 inhibitors- N/A SGLT-2 instructions:N/A  Blood Thinner Instructions:N/A Aspirin Instructions:N/A Last Dose:N/A  Activity level: Can go up a flight of stairs and activities of daily living without stopping and without chest pain and/or shortness of breath       Anesthesia review:  CHF, HTN, DM, CKD  Patient denies shortness of breath, fever, cough and chest pain at PAT appointment   Patient verbalized understanding of instructions reviewed via telephone.

## 2022-11-14 NOTE — H&P (Signed)
PREOPERATIVE H&P  Chief Complaint: left humerus fracture  HPI: Ian Dudley is a 81 y.o. male who is scheduled for, Procedure(s): INTRAMEDULLARY (IM) NAIL HUMERAL.   Patient has a past medical history significant for former smoker with h/o HTN, CHF, CKD, DM, prostate cancer .   Patient had a fall on 11/11/2022 when he landed on his left shoulder. He was at church and fell over a cable. He went to [facility] ED. Work-up in the ED was significant for spiral fracture of the proximal humeral metadiaphysis. He was sent to Dr. Griffin Basil for surgical discussion.   Symptoms are rated as moderate to severe, and have been worsening.  This is significantly impairing activities of daily living.    Please see clinic note for further details on this patient's care.    He has elected for surgical management.   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    Insect bite    Localized swelling, mass and lump, unspecified lower limb    Myalgia    Osteoarthritis    Other forms of dyspnea    Other long term (current) drug therapy    Pain in right knee    Personal history of malignant neoplasm of prostate    Prostate cancer ([place])    Pure hypercholesterolemia    Secondary cataract    Past Surgical History:  Procedure Laterality Date   PROSTATECTOMY     TONSILLECTOMY     Social History   Socioeconomic History   Marital status: Married    Spouse name: Not on file   Number of children: Not on file   Years of education: Not on file   Highest education level: Not on file  Occupational History   Not on file  Tobacco Use   Smoking  status: Former   Smokeless tobacco: Never  Vaping Use   Vaping Use: Never used  Substance and Sexual Activity   Alcohol use: No    Alcohol/week: 0.0 standard drinks of alcohol   Drug use: No   Sexual activity: Not on file  Other Topics Concern   Not on file  Social History Narrative   Not on file   Social Determinants of Health   Financial Resource Strain: Not on file  Food Insecurity: Not on file  Transportation Needs: Not on file  Physical Activity: Not on file  Stress: Not on file  Social Connections: Not on file   Family History  Problem Relation Age of Onset   Hypertension Mother    Diabetes Mother    Kidney failure Mother    Breast cancer Sister    Lupus Sister    Colon cancer Brother    Prostate cancer Brother    Colon cancer Sister    Healthy Sister    Healthy Brother    Healthy Brother    No Known Allergies Prior to Admission medications   Medication Sig Start Date End Date Taking? Authorizing Provider  acetaminophen (TYLENOL) 500 MG tablet Take 1,000 mg by mouth every 6 (six)  hours as needed for moderate pain or mild pain.   Yes [provider]  glipiZIDE (GLUCOTROL XL) 10 MG 24 hr tablet Take 10 mg by mouth 4 (four) times a week. 10/14/22  Yes [provider]  HYDROcodone-acetaminophen (NORCO/VICODIN) 5-325 MG tablet Take 1 tablet by mouth every 8 (eight) hours as needed for up to 3 days for severe pain. 11/11/22 11/14/22 Yes Nanavati, Thelma Comp, MD  lisinopril-hydrochlorothiazide (PRINZIDE,ZESTORETIC) 10-12.5 MG tablet Take 1 tablet by mouth daily. THIS IS THE LAST AUTHORIZED REFILL FOR THIS MEDICATION.  Please have future refills authorized by PCP 03/07/17  Yes Belva Crome, MD  metFORMIN (GLUCOPHAGE) 500 MG tablet Take 500 mg by mouth every Monday, Wednesday, and Friday.   Yes [provider]  Omega-3 Fatty Acids (OMEGA 3 500 PO) Take 500 mg by mouth daily.   Yes [provider]  pioglitazone (ACTOS) 30 MG tablet Take 30 mg by  mouth daily.   Yes [provider]  simvastatin (ZOCOR) 10 MG tablet Take 10 mg by mouth daily.   Yes [provider]  XTANDI 40 MG tablet Take 160 mg by mouth daily. 11/07/22  Yes [provider]    ROS: All other systems have been reviewed and were otherwise negative with the exception of those mentioned in the HPI and as above.  Physical Exam: General: Alert, no acute distress Cardiovascular: No pedal edema Respiratory: No cyanosis, no use of accessory musculature GI: No organomegaly, abdomen is soft and non-tender Skin: No lesions in the area of chief complaint Neurologic: Sensation intact distally Psychiatric: Patient is competent for consent with normal mood and affect Lymphatic: No axillary or cervical lymphadenopathy  MUSCULOSKELETAL:  LUE: ROM not tested in setting of known fracture. Distal motor and sensory function intact  Imaging: CT of the left should demonstrates a spiral fracture of the proximal humeral metadiaphysis  Assessment: left humerus fracture  Plan: Plan for Procedure(s): INTRAMEDULLARY (IM) NAIL HUMERAL   The risks benefits and alternatives were discussed with the patient including but not limited to the risks of nonoperative treatment, versus surgical intervention including infection, bleeding, nerve injury,  blood clots, cardiopulmonary complications, morbidity, mortality, among others, and they were willing to proceed.   The patient acknowledged the explanation, agreed to proceed with the plan and consent was signed.   Operative Plan: Intramedullary nailing of left humerus fracture Discharge Medications: Standard DVT Prophylaxis: aspirin Physical Therapy: outpatient PT Special Discharge needs: sling. Ice man   Ethelda Chick, [place]  11/14/2022 5:43 PM

## 2022-11-15 ENCOUNTER — Ambulatory Visit (HOSPITAL_COMMUNITY): Payer: Medicare Other

## 2022-11-15 ENCOUNTER — Ambulatory Visit (HOSPITAL_BASED_OUTPATIENT_CLINIC_OR_DEPARTMENT_OTHER): Payer: Medicare Other | Admitting: Certified Registered Nurse Anesthetist

## 2022-11-15 ENCOUNTER — Encounter (HOSPITAL_COMMUNITY): Payer: Self-pay | Admitting: Orthopaedic Surgery

## 2022-11-15 ENCOUNTER — Encounter (HOSPITAL_COMMUNITY)
Admission: RE | Disposition: A | Discharge status: Home / Self Care | Payer: Self-pay | Source: Ambulatory Visit | Attending: Orthopaedic Surgery

## 2022-11-15 ENCOUNTER — Ambulatory Visit (HOSPITAL_COMMUNITY)
Admission: RE | Admit: 2022-11-15 | Discharge: 2022-11-15 | Disposition: A | Discharge status: Home / Self Care | Payer: Medicare Other | Source: Ambulatory Visit | Attending: Orthopaedic Surgery | Admitting: Orthopaedic Surgery

## 2022-11-15 ENCOUNTER — Ambulatory Visit (HOSPITAL_COMMUNITY): Payer: Medicare Other | Admitting: Certified Registered Nurse Anesthetist

## 2022-11-15 ENCOUNTER — Other Ambulatory Visit: Payer: Self-pay

## 2022-11-15 DIAGNOSIS — Z87891 Personal history of nicotine dependence: Secondary | ICD-10-CM

## 2022-11-15 DIAGNOSIS — Z79899 Other long term (current) drug therapy: Secondary | ICD-10-CM | POA: Diagnosis not present

## 2022-11-15 DIAGNOSIS — N189 Chronic kidney disease, unspecified: Secondary | ICD-10-CM

## 2022-11-15 DIAGNOSIS — K219 Gastro-esophageal reflux disease without esophagitis: Secondary | ICD-10-CM | POA: Diagnosis not present

## 2022-11-15 DIAGNOSIS — S42292A Other displaced fracture of upper end of left humerus, initial encounter for closed fracture: Secondary | ICD-10-CM | POA: Diagnosis not present

## 2022-11-15 DIAGNOSIS — E119 Type 2 diabetes mellitus without complications: Secondary | ICD-10-CM | POA: Diagnosis not present

## 2022-11-15 DIAGNOSIS — N182 Chronic kidney disease, stage 2 (mild): Secondary | ICD-10-CM

## 2022-11-15 DIAGNOSIS — W010XXA Fall on same level from slipping, tripping and stumbling without subsequent striking against object, initial encounter: Secondary | ICD-10-CM | POA: Insufficient documentation

## 2022-11-15 DIAGNOSIS — Z7984 Long term (current) use of oral hypoglycemic drugs: Secondary | ICD-10-CM | POA: Insufficient documentation

## 2022-11-15 DIAGNOSIS — E1122 Type 2 diabetes mellitus with diabetic chronic kidney disease: Secondary | ICD-10-CM | POA: Diagnosis not present

## 2022-11-15 DIAGNOSIS — I509 Heart failure, unspecified: Secondary | ICD-10-CM | POA: Diagnosis not present

## 2022-11-15 DIAGNOSIS — I130 Hypertensive heart and chronic kidney disease with heart failure and stage 1 through stage 4 chronic kidney disease, or unspecified chronic kidney disease: Secondary | ICD-10-CM | POA: Diagnosis not present

## 2022-11-15 DIAGNOSIS — Z8546 Personal history of malignant neoplasm of prostate: Secondary | ICD-10-CM | POA: Diagnosis not present

## 2022-11-15 DIAGNOSIS — S42352A Displaced comminuted fracture of shaft of humerus, left arm, initial encounter for closed fracture: Secondary | ICD-10-CM | POA: Diagnosis not present

## 2022-11-15 HISTORY — DX: Heart failure, unspecified: I50.9

## 2022-11-15 HISTORY — DX: Atrioventricular block, first degree: I44.0

## 2022-11-15 HISTORY — PX: HUMERUS IM NAIL: SHX1769

## 2022-11-15 HISTORY — DX: Essential (primary) hypertension: I10

## 2022-11-15 HISTORY — DX: Chronic kidney disease, unspecified: N18.9

## 2022-11-15 LAB — CBC
HCT: 33.9 % — ABNORMAL LOW (ref 39.0–52.0)
Hemoglobin: 10.4 g/dL — ABNORMAL LOW (ref 13.0–17.0)
MCH: 31.7 pg (ref 26.0–34.0)
MCHC: 30.7 g/dL (ref 30.0–36.0)
MCV: 103.4 fL — ABNORMAL HIGH (ref 80.0–100.0)
Platelets: 195 10*3/uL (ref 150–400)
RBC: 3.28 MIL/uL — ABNORMAL LOW (ref 4.22–5.81)
RDW: 15.5 % (ref 11.5–15.5)
WBC: 4.5 10*3/uL (ref 4.0–10.5)
nRBC: 0 % (ref 0.0–0.2)

## 2022-11-15 LAB — BASIC METABOLIC PANEL
Anion gap: 8 (ref 5–15)
BUN: 30 mg/dL — ABNORMAL HIGH (ref 8–23)
CO2: 27 mmol/L (ref 22–32)
Calcium: 9 mg/dL (ref 8.9–10.3)
Chloride: 108 mmol/L (ref 98–111)
Creatinine, Ser: 1.55 mg/dL — ABNORMAL HIGH (ref 0.61–1.24)
GFR, Estimated: 44 mL/min — ABNORMAL LOW (ref >60)
Glucose, Bld: 120 mg/dL — ABNORMAL HIGH (ref 70–99)
Potassium: 4.3 mmol/L (ref 3.5–5.1)
Sodium: 143 mmol/L (ref 135–145)

## 2022-11-15 LAB — GLUCOSE, CAPILLARY
Glucose-Capillary: 108 mg/dL — ABNORMAL HIGH (ref 70–99)
Glucose-Capillary: 123 mg/dL — ABNORMAL HIGH (ref 70–99)

## 2022-11-15 SURGERY — INSERTION, INTRAMEDULLARY ROD, HUMERUS
Anesthesia: Regional | Site: Shoulder | Laterality: Left

## 2022-11-15 MED ORDER — OMEPRAZOLE 20 MG PO CPDR: 20.0000 mg | DELAYED_RELEASE_CAPSULE | Freq: Every day | ORAL | 0 refills | Status: AC

## 2022-11-15 MED ORDER — ACETAMINOPHEN 10 MG/ML IV SOLN
INTRAVENOUS | Status: AC
  Filled 2022-11-15: qty 100

## 2022-11-15 MED ORDER — LIDOCAINE HCL (PF) 2 % IJ SOLN
INTRAMUSCULAR | Status: AC
  Filled 2022-11-15: qty 5

## 2022-11-15 MED ORDER — PHENYLEPHRINE HCL-NACL 20-0.9 MG/250ML-% IV SOLN
INTRAVENOUS | Status: DC | PRN
  Administered 2022-11-15: 50 ug/min via INTRAVENOUS

## 2022-11-15 MED ORDER — CEFAZOLIN SODIUM-DEXTROSE 2-4 GM/100ML-% IV SOLN
2.0000 g | INTRAVENOUS | Status: AC
  Administered 2022-11-15: 2 g via INTRAVENOUS
  Filled 2022-11-15: qty 100

## 2022-11-15 MED ORDER — OXYCODONE HCL 5 MG PO TABS: ORAL_TABLET | ORAL | 0 refills | Status: AC

## 2022-11-15 MED ORDER — 0.9 % SODIUM CHLORIDE (POUR BTL) OPTIME
TOPICAL | Status: DC | PRN
  Administered 2022-11-15: 1000 mL

## 2022-11-15 MED ORDER — EPHEDRINE SULFATE-NACL 50-0.9 MG/10ML-% IV SOSY
PREFILLED_SYRINGE | INTRAVENOUS | Status: DC | PRN
  Administered 2022-11-15: 5 mg via INTRAVENOUS

## 2022-11-15 MED ORDER — OXYCODONE HCL 5 MG PO TABS: 5.0000 mg | ORAL_TABLET | Freq: Once | ORAL | Status: DC | PRN

## 2022-11-15 MED ORDER — ONDANSETRON HCL 4 MG/2ML IJ SOLN
INTRAMUSCULAR | Status: DC | PRN
  Administered 2022-11-15: 4 mg via INTRAVENOUS

## 2022-11-15 MED ORDER — ONDANSETRON HCL 4 MG PO TABS: 4.0000 mg | ORAL_TABLET | Freq: Three times a day (TID) | ORAL | 0 refills | Status: AC | PRN

## 2022-11-15 MED ORDER — PROPOFOL 10 MG/ML IV BOLUS
INTRAVENOUS | Status: AC
  Filled 2022-11-15: qty 20

## 2022-11-15 MED ORDER — PROPOFOL 10 MG/ML IV BOLUS
INTRAVENOUS | Status: DC | PRN
  Administered 2022-11-15: 20 mg via INTRAVENOUS
  Administered 2022-11-15: 110 mg via INTRAVENOUS

## 2022-11-15 MED ORDER — CHLORHEXIDINE GLUCONATE 0.12 % MT SOLN
15.0000 mL | Freq: Once | OROMUCOSAL | Status: AC
  Administered 2022-11-15: 15 mL via OROMUCOSAL

## 2022-11-15 MED ORDER — PHENYLEPHRINE HCL-NACL 20-0.9 MG/250ML-% IV SOLN
INTRAVENOUS | Status: AC
  Filled 2022-11-15: qty 250

## 2022-11-15 MED ORDER — VANCOMYCIN HCL 1000 MG IV SOLR
INTRAVENOUS | Status: AC
  Filled 2022-11-15: qty 20

## 2022-11-15 MED ORDER — SUGAMMADEX SODIUM 500 MG/5ML IV SOLN
INTRAVENOUS | Status: DC | PRN
  Administered 2022-11-15: 300 mg via INTRAVENOUS

## 2022-11-15 MED ORDER — DEXAMETHASONE SODIUM PHOSPHATE 10 MG/ML IJ SOLN
INTRAMUSCULAR | Status: AC
  Filled 2022-11-15: qty 1

## 2022-11-15 MED ORDER — BUPIVACAINE LIPOSOME 1.3 % IJ SUSP
INTRAMUSCULAR | Status: DC | PRN
  Administered 2022-11-15: 10 mL via PERINEURAL

## 2022-11-15 MED ORDER — PROPOFOL 1000 MG/100ML IV EMUL
INTRAVENOUS | Status: AC
  Filled 2022-11-15: qty 200

## 2022-11-15 MED ORDER — FENTANYL CITRATE PF 50 MCG/ML IJ SOSY: 25.0000 ug | PREFILLED_SYRINGE | INTRAMUSCULAR | Status: DC | PRN

## 2022-11-15 MED ORDER — FENTANYL CITRATE (PF) 100 MCG/2ML IJ SOLN
INTRAMUSCULAR | Status: DC | PRN
  Administered 2022-11-15 (×2): 50 ug via INTRAVENOUS

## 2022-11-15 MED ORDER — ROCURONIUM BROMIDE 10 MG/ML (PF) SYRINGE
PREFILLED_SYRINGE | INTRAVENOUS | Status: DC | PRN
  Administered 2022-11-15: 70 mg via INTRAVENOUS

## 2022-11-15 MED ORDER — ACETAMINOPHEN 10 MG/ML IV SOLN
1000.0000 mg | Freq: Once | INTRAVENOUS | Status: DC | PRN
  Administered 2022-11-15: 1000 mg via INTRAVENOUS

## 2022-11-15 MED ORDER — DEXAMETHASONE SODIUM PHOSPHATE 10 MG/ML IJ SOLN
INTRAMUSCULAR | Status: DC | PRN
  Administered 2022-11-15: 4 mg via INTRAVENOUS

## 2022-11-15 MED ORDER — SUGAMMADEX SODIUM 500 MG/5ML IV SOLN
INTRAVENOUS | Status: AC
  Filled 2022-11-15: qty 5

## 2022-11-15 MED ORDER — BUPIVACAINE-EPINEPHRINE (PF) 0.5% -1:200000 IJ SOLN
INTRAMUSCULAR | Status: DC | PRN
  Administered 2022-11-15: 15 mL via PERINEURAL

## 2022-11-15 MED ORDER — ORAL CARE MOUTH RINSE: 15.0000 mL | Freq: Once | OROMUCOSAL | Status: AC

## 2022-11-15 MED ORDER — ASPIRIN 81 MG PO CHEW: 81.0000 mg | CHEWABLE_TABLET | Freq: Two times a day (BID) | ORAL | 0 refills | Status: AC

## 2022-11-15 MED ORDER — LACTATED RINGERS IV SOLN: INTRAVENOUS | Status: DC

## 2022-11-15 MED ORDER — ONDANSETRON HCL 4 MG/2ML IJ SOLN
INTRAMUSCULAR | Status: AC
  Filled 2022-11-15: qty 2

## 2022-11-15 MED ORDER — ROCURONIUM BROMIDE 10 MG/ML (PF) SYRINGE
PREFILLED_SYRINGE | INTRAVENOUS | Status: AC
  Filled 2022-11-15: qty 10

## 2022-11-15 MED ORDER — ACETAMINOPHEN 500 MG PO TABS: 1000.0000 mg | ORAL_TABLET | Freq: Three times a day (TID) | ORAL | 0 refills | Status: AC

## 2022-11-15 MED ORDER — OXYCODONE HCL 5 MG/5ML PO SOLN: 5.0000 mg | Freq: Once | ORAL | Status: DC | PRN

## 2022-11-15 MED ORDER — MELOXICAM 15 MG PO TABS: 15.0000 mg | ORAL_TABLET | Freq: Every day | ORAL | 0 refills | Status: DC

## 2022-11-15 MED ORDER — VANCOMYCIN HCL 1 G IV SOLR
INTRAVENOUS | Status: DC | PRN
  Administered 2022-11-15: 1000 mg

## 2022-11-15 MED ORDER — EPHEDRINE 5 MG/ML INJ
INTRAVENOUS | Status: AC
  Filled 2022-11-15: qty 5

## 2022-11-15 MED ORDER — FENTANYL CITRATE (PF) 100 MCG/2ML IJ SOLN
INTRAMUSCULAR | Status: AC
  Filled 2022-11-15: qty 2

## 2022-11-15 MED ORDER — ACETAMINOPHEN 500 MG PO TABS: 1000.0000 mg | ORAL_TABLET | Freq: Once | ORAL | Status: DC | PRN

## 2022-11-15 MED ORDER — PROPOFOL 500 MG/50ML IV EMUL
INTRAVENOUS | Status: DC | PRN
  Administered 2022-11-15: 125 ug/kg/min via INTRAVENOUS

## 2022-11-15 MED ORDER — ACETAMINOPHEN 160 MG/5ML PO SOLN: 1000.0000 mg | Freq: Once | ORAL | Status: DC | PRN

## 2022-11-15 SURGICAL SUPPLY — 59 items
BAG COUNTER SPONGE SURGICOUNT (BAG) IMPLANT
BAG ZIPLOCK 12X15 (MISCELLANEOUS) ×1 IMPLANT
BIT DRILL 3.8X270 (BIT) IMPLANT
BIT DRILL 6.5 CONICAL (BIT) IMPLANT
BIT DRILL SHORT 3.2MM (DRILL) IMPLANT
CHLORAPREP W/TINT 26 (MISCELLANEOUS) ×2 IMPLANT
CLSR STERI-STRIP ANTIMIC 1/2X4 (GAUZE/BANDAGES/DRESSINGS) ×2 IMPLANT
COVER SURGICAL LIGHT HANDLE (MISCELLANEOUS) ×1 IMPLANT
DRAPE C-ARM 42X120 X-RAY (DRAPES) ×1 IMPLANT
DRAPE ORTHO SPLIT 77X108 STRL (DRAPES) ×2
DRAPE SHEET LG 3/4 BI-LAMINATE (DRAPES) ×2 IMPLANT
DRAPE SURG ORHT 6 SPLT 77X108 (DRAPES) ×2 IMPLANT
DRILL SHORT 3.2MM (DRILL) ×1
DRSG ADAPTIC 3X8 NADH LF (GAUZE/BANDAGES/DRESSINGS) IMPLANT
DRSG AQUACEL AG 3.5X4 (GAUZE/BANDAGES/DRESSINGS) IMPLANT
DRSG AQUACEL AG ADV 3.5X 6 (GAUZE/BANDAGES/DRESSINGS) IMPLANT
DRSG AQUACEL AG ADV 3.5X10 (GAUZE/BANDAGES/DRESSINGS) IMPLANT
DRSG EMULSION OIL 3X16 NADH (GAUZE/BANDAGES/DRESSINGS) IMPLANT
ELECT REM PT RETURN 15FT ADLT (MISCELLANEOUS) ×1 IMPLANT
GAUZE PAD ABD 8X10 STRL (GAUZE/BANDAGES/DRESSINGS) IMPLANT
GAUZE SPONGE 4X4 12PLY STRL (GAUZE/BANDAGES/DRESSINGS) IMPLANT
GAUZE XEROFORM 1X8 LF (GAUZE/BANDAGES/DRESSINGS) IMPLANT
GLOVE BIO SURGEON STRL SZ 6.5 (GLOVE) ×1 IMPLANT
GLOVE BIOGEL PI IND STRL 6.5 (GLOVE) ×1 IMPLANT
GLOVE BIOGEL PI IND STRL 8 (GLOVE) ×1 IMPLANT
GLOVE ECLIPSE 8.0 STRL XLNG CF (GLOVE) ×2 IMPLANT
GOWN STRL REUS W/ TWL LRG LVL3 (GOWN DISPOSABLE) ×2 IMPLANT
GOWN STRL REUS W/TWL LRG LVL3 (GOWN DISPOSABLE) ×2
K-WIRE 1.6X150 (WIRE) ×1
K-WIRE TROCAR POINT 2.5X280 (WIRE) ×1
KIT BASIN OR (CUSTOM PROCEDURE TRAY) ×1 IMPLANT
KIT STABILIZATION SHOULDER (MISCELLANEOUS) ×1 IMPLANT
KIT TURNOVER KIT A (KITS) IMPLANT
KWIRE 1.6X150 (WIRE) IMPLANT
KWIRE TROCAR POINT 2.5X280 (WIRE) IMPLANT
NAIL CANN HUMERAL ML 7X240 LT (Nail) IMPLANT
NDL SPNL 18GX3.5 QUINCKE PK (NEEDLE) ×1 IMPLANT
NEEDLE SPNL 18GX3.5 QUINCKE PK (NEEDLE) ×1 IMPLANT
NS IRRIG 1000ML POUR BTL (IV SOLUTION) ×1 IMPLANT
PACK SHOULDER (CUSTOM PROCEDURE TRAY) ×1 IMPLANT
PROTECTOR NERVE ULNAR (MISCELLANEOUS) ×1 IMPLANT
RESTRAINT HEAD UNIVERSAL NS (MISCELLANEOUS) ×1 IMPLANT
ROD REAMING 2.5 (INSTRUMENTS) IMPLANT
SCREW LOCK 4.5X40 (Screw) ×1 IMPLANT
SCREW LOCK FT 40X4.5X3.9XSLF (Screw) IMPLANT
SCREW LOCKING 4.0 28MM (Screw) IMPLANT
SCREW TI MULTILOC 4.5X42 (Screw) IMPLANT
SLING ARM FOAM STRAP LRG (SOFTGOODS) IMPLANT
SUT ETHIBOND NAB CT1 #1 30IN (SUTURE) IMPLANT
SUT ETHILON 2 0 PS N (SUTURE) IMPLANT
SUT FIBERWIRE #2 38 T-5 BLUE (SUTURE)
SUT MNCRL AB 4-0 PS2 18 (SUTURE) IMPLANT
SUT VIC AB 0 CT1 36 (SUTURE) ×1 IMPLANT
SUT VIC AB 1 CT1 36 (SUTURE) IMPLANT
SUT VIC AB 2-0 CT1 27 (SUTURE) ×1
SUT VIC AB 2-0 CT1 TAPERPNT 27 (SUTURE) ×1 IMPLANT
SUTURE FIBERWR #2 38 T-5 BLUE (SUTURE) IMPLANT
TOWEL OR 17X26 10 PK STRL BLUE (TOWEL DISPOSABLE) ×1 IMPLANT
WATER STERILE IRR 1000ML POUR (IV SOLUTION) ×1 IMPLANT

## 2022-11-15 NOTE — Transfer of Care (Signed)
Immediate Anesthesia Transfer of Care Note  Patient: Ian Dudley  Procedure(s) Performed: INTRAMEDULLARY (IM) NAIL HUMERAL (Left: Shoulder)  Patient Location: PACU  Anesthesia Type:GA combined with regional for post-op pain  Level of Consciousness: awake and patient cooperative  Airway & Oxygen Therapy: Patient Spontanous Breathing and Patient connected to face mask oxygen  Post-op Assessment: Report given to RN and Post -op Vital signs reviewed and stable  Post vital signs: Reviewed and stable  Last Vitals:  Vitals Value Taken Time  BP 113/46 11/15/22 1004  Temp    Pulse 65 11/15/22 1006  Resp 15 11/15/22 1006  SpO2 100 % 11/15/22 1006  Vitals shown include unvalidated device data.  Last Pain:  Vitals:   11/15/22 0637  TempSrc: Oral  PainSc: 3       Patients Stated Pain Goal: 3 (02/15/93 5859)  Complications: No notable events documented.

## 2022-11-15 NOTE — Discharge Instructions (Addendum)
Ophelia Charter MD, MPH Noemi Chapel, PA-C [place] [address], [address] [phone] (tel)   [phone] (fax)   [place] may leave the operative dressing in place until your follow-up appointment. KEEP THE INCISIONS CLEAN AND DRY. There may be a small amount of fluid/bleeding leaking at the surgical site. This is normal after surgery.  If it fills with liquid or blood please call us immediately to change it for you. Use the provided ice machine or Ice packs as often as possible for the first 3-4 days, then as needed for pain relief.   Keep a layer of cloth or a shirt between your skin and the cooling unit to prevent frost bite as it can get very cold.  SHOWERING: - You may shower on Post-Op Day #2.  - The dressing is water resistant but do not scrub it as it may start to peel up.   - You may remove the sling for showering - Gently pat the area dry.  - Do not soak the shoulder in water.  - Do not go swimming in the pool or ocean until your incision has completely healed (about 4-6 weeks after surgery) - KEEP THE INCISIONS CLEAN AND DRY.  EXERCISES Wear the sling at all times  You may remove the sling for showering, but keep the arm across the chest or in a secondary sling.     It is normal for your fingers/hand to become more swollen after surgery and discolored from bruising.   This will resolve over the first few weeks usually after surgery. Please continue to ambulate and do not stay sitting or lying for too long.  Perform foot and wrist pumps to assist in circulation.  PHYSICAL THERAPY - Our office will call you to schedule post-op physical therapy  REGIONAL ANESTHESIA (NERVE BLOCKS) The anesthesia team may have performed a nerve block for you this is a great tool used to minimize pain.   The block may start wearing off overnight (between 8-24 hours postop) When the block wears off, your pain may go from  nearly zero to the pain you would have had postop without the block. This is an abrupt transition but nothing dangerous is happening.   This can be a challenging period but utilize your as needed pain medications to try and manage this period. We suggest you use the pain medication the first night prior to going to bed, to ease this transition.  You may take an extra dose of narcotic when this happens if needed  POST-OP MEDICATIONS- Multimodal approach to pain control In general your pain will be controlled with a combination of substances.  Prescriptions unless otherwise discussed are electronically sent to your pharmacy.  This is a carefully made plan we use to minimize narcotic use.     Meloxicam - Anti-inflammatory medication taken on a scheduled basis Acetaminophen - Non-narcotic pain medicine taken on a scheduled basis  Oxycodone - This is a strong narcotic, to be used only on an "as needed" basis for SEVERE pain. Aspirin '81mg'$  - This medicine is used to minimize the risk of blood clots after surgery. Omeprazole - daily medicine to protect your stomach while taking anti-inflammatories.   Zofran - take as needed for nausea   FOLLOW-UP If you develop a Fever (?101.5), Redness or Drainage from the surgical incision site, please call our office to arrange for an evaluation. Please call the office to schedule a follow-up appointment for your  first post-operative appointment, 7-10 days post-operatively.    HELPFUL INFORMATION   You may be more comfortable sleeping in a semi-seated position the first few nights following surgery.  Keep a pillow propped under the elbow and forearm for comfort.  If you have a recliner type of chair it might be beneficial.  If not that is fine too, but it would be helpful to sleep propped up with pillows behind your operated shoulder as well under your elbow and forearm.  This will reduce pulling on the suture lines.  When dressing, put your operative arm in  the sleeve first.  When getting undressed, take your operative arm out last.  Loose fitting, button-down shirts are recommended.  Often in the first days after surgery you may be more comfortable keeping your operative arm under your shirt and not through the sleeve.  You may return to work/school in the next couple of days when you feel up to it.  Desk work and typing in the sling is fine.  We suggest you use the pain medication the first night prior to going to bed, in order to ease any pain when the anesthesia wears off. You should avoid taking pain medications on an empty stomach as it will make you nauseous.  You should wean off your narcotic medicines as soon as you are able.  Most patients will be off or using minimal narcotics before their first postop appointment.   Do not drink alcoholic beverages or take illicit drugs when taking pain medications.  It is against the law to drive while taking narcotics.  In some states it is against the law to drive while your arm is in a sling.   Pain medication may make you constipated.  Below are a few solutions to try in this order: Decrease the amount of pain medication if you aren't having pain. Drink lots of decaffeinated fluids. Drink prune juice and/or eat dried prunes  If the first 3 don't work start with additional solutions Take Colace - an over-the-counter stool softener Take Senokot - an over-the-counter laxative Take Miralax - a stronger over-the-counter laxative  For more information including helpful videos and documents visit our website:   [email or web address]

## 2022-11-15 NOTE — Anesthesia Preprocedure Evaluation (Signed)
Anesthesia Evaluation  Patient identified by MRN, date of birth, ID band Patient awake    Reviewed: Allergy & Precautions, NPO status , Patient's Chart, lab work & pertinent test results  History of Anesthesia Complications Negative for: history of anesthetic complications  Airway Mallampati: II  TM Distance: >3 FB Neck ROM: Full    Dental  (+) Partial Upper, Dental Advisory Given,    Pulmonary neg shortness of breath, neg sleep apnea, neg COPD, neg recent URI, former smoker   breath sounds clear to auscultation       Cardiovascular hypertension, Pt. on medications +CHF  + dysrhythmias  Rhythm:Regular   1. Left ventricular ejection fraction, by estimation, is 60 to 65%. Left  ventricular ejection fraction by 3D volume is 64 %. The left ventricle has  normal function. The left ventricle has no regional wall motion  abnormalities. Left ventricular diastolic   parameters are consistent with Grade I diastolic dysfunction (impaired  relaxation).   2. Right ventricular systolic function is normal. The right ventricular  size is normal. Tricuspid regurgitation signal is inadequate for assessing  PA pressure.   3. The mitral valve is normal in structure. Trivial mitral valve  regurgitation. No evidence of mitral stenosis.   4. The aortic valve is normal in structure. Aortic valve regurgitation is  not visualized. No aortic stenosis is present.   5. The inferior vena cava is normal in size with greater than 50%  respiratory variability, suggesting right atrial pressure of 3 mmHg.     Neuro/Psych negative neurological ROS  negative psych ROS   GI/Hepatic Neg liver ROS,GERD  Medicated and Controlled,,  Endo/Other  diabetes  No results found for: "HGBA1C"   Renal/GU CRFRenal diseaseLab Results      Component                Value               Date                      CREATININE               1.55 (H)            11/15/2022                 Musculoskeletal  (+) Arthritis ,   left humerus fracture   Abdominal   Peds  Hematology negative hematology ROS (+) No results found for: "WBC", "HGB", "HCT", "MCV", "PLT"    Anesthesia Other Findings   Reproductive/Obstetrics                             Anesthesia Physical Anesthesia Plan  ASA: 3  Anesthesia Plan: General and Regional   Post-op Pain Management: Regional block*   Induction: Intravenous  PONV Risk Score and Plan: 2 and Ondansetron and Dexamethasone  Airway Management Planned: Oral ETT  Additional Equipment: None  Intra-op Plan:   Post-operative Plan: Extubation in OR  Informed Consent: I have reviewed the patients History and Physical, chart, labs and discussed the procedure including the risks, benefits and alternatives for the proposed anesthesia with the patient or authorized representative who has indicated his/her understanding and acceptance.     Dental advisory given  Plan Discussed with: CRNA  Anesthesia Plan Comments:        Anesthesia Quick Evaluation

## 2022-11-15 NOTE — Interval H&P Note (Signed)
All questions answered

## 2022-11-15 NOTE — Anesthesia Postprocedure Evaluation (Signed)
Anesthesia Post Note  Patient: Ian Dudley  Procedure(s) Performed: INTRAMEDULLARY (IM) NAIL HUMERAL (Left: Shoulder)     Patient location during evaluation: PACU Anesthesia Type: Regional and General Level of consciousness: awake and alert Pain management: pain level controlled Vital Signs Assessment: post-procedure vital signs reviewed and stable Respiratory status: spontaneous breathing, nonlabored ventilation and respiratory function stable Cardiovascular status: blood pressure returned to baseline and stable Postop Assessment: no apparent nausea or vomiting Anesthetic complications: no  No notable events documented.  Last Vitals:  Vitals:   11/15/22 1030 11/15/22 1048  BP: (!) 123/47 (!) 122/55  Pulse: 64 71  Resp: 14 18  Temp: 36.7 C 36.4 C  SpO2: 92% 93%    Last Pain:  Vitals:   11/15/22 1048  TempSrc: Oral  PainSc:                  Krista Som

## 2022-11-15 NOTE — Anesthesia Procedure Notes (Signed)
Procedure Name: Intubation Date/Time: 11/15/2022 8:41 AM  Performed by: West Pugh, CRNAPre-anesthesia Checklist: Patient identified, Emergency Drugs available, Suction available, Patient being monitored and Timeout performed Patient Re-evaluated:Patient Re-evaluated prior to induction Oxygen Delivery Method: Circle system utilized Preoxygenation: Pre-oxygenation with 100% oxygen Induction Type: IV induction Ventilation: Mask ventilation without difficulty Laryngoscope Size: Mac and 4 Grade View: Grade I Tube type: Oral Tube size: 7.5 mm Number of attempts: 1 Airway Equipment and Method: Stylet Placement Confirmation: ETT inserted through vocal cords under direct vision, positive ETCO2, CO2 detector and breath sounds checked- equal and bilateral Secured at: 23 cm Tube secured with: Tape Dental Injury: Teeth and Oropharynx as per pre-operative assessment

## 2022-11-15 NOTE — Anesthesia Procedure Notes (Signed)
Anesthesia Regional Block: Interscalene brachial plexus block   Pre-Anesthetic Checklist: , timeout performed,  Correct Patient, Correct Site, Correct Laterality,  Correct Procedure, Correct Position, site marked,  Risks and benefits discussed,  Surgical consent,  Pre-op evaluation,  At surgeon's request and post-op pain management  Laterality: Left and Lower  Prep: chloraprep       Needles:  Injection technique: Single-shot      Needle Length: 5cm  Needle Gauge: 22     Additional Needles: Arrow StimuQuik ECHO Echogenic Stimulating PNB Needle  Procedures:,,,, ultrasound used (permanent image in chart),,    Narrative:  Start time: 11/15/2022 8:17 AM End time: 11/15/2022 8:22 AM Injection made incrementally with aspirations every 5 mL.  Performed by: Personally  Anesthesiologist: Oleta Mouse, MD

## 2022-11-15 NOTE — Op Note (Signed)
Orthopaedic Surgery Operative Note (CSN: [id number])  Ian Dudley  1941/02/21 Date of Surgery: 11/15/2022   Diagnoses:  left humeral shaft comminuted fracture  Procedure: Left humeral nail intramedullary fixation   Operative Finding Successful completion of the planned procedure.  Patient's bone quality was relatively good proximally, his canal was relatively tight distally however were able to place our nail without reaming.  We used a percutaneous technique checking on orthogonal imaging to ensure that we were in the appropriate starting point of the head.  Case was relatively routine.  We did use the pin system to check to make sure that our nail was countersunk however even though the pin system while placed through a percutaneous portal after advancing the nail demonstrated that we were deeper than 5 mm nail was perhaps 1 mm proud.  Based on the patient's bone quality and overall fixation we felt that try to remove our screws trying to sink the nail further would likely cause damage or potentially cause a fracture distally.  We felt that leaving flush to 1 mm proud was appropriate considering this.  Based on its position we do not think it will cause any symptoms for the patient.  Post-operative plan: The patient will be nonweightbearing with sling for comfort, use of hand at waist level for ADLs as appropriate.  The patient will be discharged home.  DVT prophylaxis Xarelto 10 mg/day.   Pain control with PRN pain medication preferring oral medicines.  Follow up plan will be scheduled in approximately 7 days for incision check and XR.  Post-Op Diagnosis: Same Surgeons:Primary: Hiram Gash, MD Assistants:Caroline McBane PA-C Location: [facility] Anesthesia: General with regional anesthesia Antibiotics: Ancef 2 g with local vancomycin powder 1 g at the surgical site Tourniquet time:  Estimated Blood Loss: Minimal Complications: None Specimens: None Implants: Implant Name Type  Inv. Item Serial No. Manufacturer Lot No. LRB No. Used Action  60m TI MULTILOC HUMERAL NAIL LEFT/CANN/240MM    SYNTHES [id number] Implanted  SCREW LOCK 4.5X40 - [id number] SCREW LOCK 4.5X40  [employer]  Left 1 Implanted  SCREW LOCKING 4.0 28MM - [id number] SCREW LOCKING 4.0 28MM  [employer]  Left 1 Implanted  SCREW TI MULTILOC 4.5X42 - [id number] SCREW TI MULTILOC 4.5X42  [employer]  Left 1 Implanted    Indications for Surgery:   Ian RUSSEYis a 81y.o. male with fall resulting in a complex humeral shaft fracture in the setting of history of cancer.  No sign of pathologic fracture.  Benefits and risks of operative and nonoperative management were discussed prior to surgery with patient/guardian(s) and informed consent form was completed.  Specific risks including infection, need for additional surgery, periprosthetic fracture, cuff injury, arthrosis.   Procedure:   The patient was identified properly. Informed consent was obtained and the surgical site was marked. The patient was taken up to suite where general anesthesia was induced.  The patient was positioned ACamera operator  The left humerus was prepped and draped in the usual sterile fashion.  Timeout was performed before the beginning of the case.  We began by using a spinal needle and orthogonal imaging to identify that our starting point was appropriate.  I made a percutaneous type incision just anterior to the [facility] with the arm in relative extension.  We checked our position again on fluoroscopy and placed our guidepin.  We bluntly dissected down past the rotator cuff with a hemostat and manual  palpation to ensure that we were not damage of the rotator cuff.  Once that was complete we were able to place a 9 mm [employer] reamer on oscillate through the rotator cuff entering the humeral head and then spending it to an appropriate depth.  We then followed this over the pin with  the starting awl to get to 10 mm.  Should have this we were able to place a long guidewire down to the elbow checking its length on fluoroscopy and selecting a long humeral nail, 240 mm from [employer].  Fracture was a complex fracture with a lateral cortical wall component that was a free fragment.  Traction reduce the fracture quite well.  We did not ream and we selected our nail and placed it over guidewire.  We noted that it was a relatively tight fit distally however it was able to advance without issue.  We checked with a percutaneously placed guidewire to ensure that her nail was in the appropriate depth below the humeral head noting that 5 mm was encountered without issue.  We used the outrigger to place 2 proximal screws in typical fashion using percutaneously placed incisions and spreading through the deep surfaces down to bone.  We had good fixation of these 2 screws.    Plan we took final fluoroscopic images the proximal end of the nail however we noted that the nail was flush with the humeral head versus potentially a millimeter proud.  Based on the placement of the screws we felt that removing the screws could propagate fracture and cause complications for the patient.  We felt that this would likely not cause the patient any symptoms.  Additionally it was in the patient's best interest to not remove the nail and replace it.  We checked with representation from the company to try and understand why that we measured 5 mm below the articular surface however overall I was quite happy with the position though I would have liked it to be 1 mm deeper.  We used perfect circle technique to place a single distal interlock after making a 2 cm incision and dissecting down bluntly with a finger to the bone.  Verbal placed blunt retractors to ensure that were directly on bone without any neurovascular structures in the way.  Around the world films demonstrated no perforation of the joint surface and good  fixation of the proximal distal aspects of the nail.  We irrigated copiously.  We again irrigated before placing local vancomycin powder and closing the incision in a multilayer fashion with nonabsorbable sutures in the setting of cancer.  Sterile dressing was placed and the patient was placed in a sling.  Patient awoken taken to PACU in stable condition.  Noemi Chapel, PA-C, present and scrubbed throughout the case, critical for completion in a timely fashion, and for retraction, instrumentation, closure.

## 2022-11-16 LAB — HEMOGLOBIN A1C
Hgb A1c MFr Bld: 5.5 % (ref 4.8–5.6)
Mean Plasma Glucose: 111 mg/dL

## 2022-11-21 DIAGNOSIS — M6281 Muscle weakness (generalized): Secondary | ICD-10-CM | POA: Diagnosis not present

## 2022-11-21 DIAGNOSIS — M25612 Stiffness of left shoulder, not elsewhere classified: Secondary | ICD-10-CM | POA: Diagnosis not present

## 2022-11-21 DIAGNOSIS — S42352D Displaced comminuted fracture of shaft of humerus, left arm, subsequent encounter for fracture with routine healing: Secondary | ICD-10-CM | POA: Diagnosis not present

## 2022-11-22 ENCOUNTER — Encounter (HOSPITAL_COMMUNITY): Payer: Self-pay | Admitting: Orthopaedic Surgery

## 2022-11-23 DIAGNOSIS — M6281 Muscle weakness (generalized): Secondary | ICD-10-CM | POA: Diagnosis not present

## 2022-11-23 DIAGNOSIS — S42352D Displaced comminuted fracture of shaft of humerus, left arm, subsequent encounter for fracture with routine healing: Secondary | ICD-10-CM | POA: Diagnosis not present

## 2022-11-23 DIAGNOSIS — M25612 Stiffness of left shoulder, not elsewhere classified: Secondary | ICD-10-CM | POA: Diagnosis not present

## 2022-11-24 DIAGNOSIS — S42352D Displaced comminuted fracture of shaft of humerus, left arm, subsequent encounter for fracture with routine healing: Secondary | ICD-10-CM | POA: Diagnosis not present

## 2022-11-29 DIAGNOSIS — M25612 Stiffness of left shoulder, not elsewhere classified: Secondary | ICD-10-CM | POA: Diagnosis not present

## 2022-11-29 DIAGNOSIS — M6281 Muscle weakness (generalized): Secondary | ICD-10-CM | POA: Diagnosis not present

## 2022-11-29 DIAGNOSIS — S42352D Displaced comminuted fracture of shaft of humerus, left arm, subsequent encounter for fracture with routine healing: Secondary | ICD-10-CM | POA: Diagnosis not present

## 2022-12-06 DIAGNOSIS — S42352D Displaced comminuted fracture of shaft of humerus, left arm, subsequent encounter for fracture with routine healing: Secondary | ICD-10-CM | POA: Diagnosis not present

## 2022-12-06 DIAGNOSIS — M6281 Muscle weakness (generalized): Secondary | ICD-10-CM | POA: Diagnosis not present

## 2022-12-06 DIAGNOSIS — M25612 Stiffness of left shoulder, not elsewhere classified: Secondary | ICD-10-CM | POA: Diagnosis not present

## 2022-12-08 DIAGNOSIS — M6281 Muscle weakness (generalized): Secondary | ICD-10-CM | POA: Diagnosis not present

## 2022-12-08 DIAGNOSIS — S42352D Displaced comminuted fracture of shaft of humerus, left arm, subsequent encounter for fracture with routine healing: Secondary | ICD-10-CM | POA: Diagnosis not present

## 2022-12-08 DIAGNOSIS — M25612 Stiffness of left shoulder, not elsewhere classified: Secondary | ICD-10-CM | POA: Diagnosis not present

## 2022-12-12 DIAGNOSIS — M6281 Muscle weakness (generalized): Secondary | ICD-10-CM | POA: Diagnosis not present

## 2022-12-12 DIAGNOSIS — S42352D Displaced comminuted fracture of shaft of humerus, left arm, subsequent encounter for fracture with routine healing: Secondary | ICD-10-CM | POA: Diagnosis not present

## 2022-12-12 DIAGNOSIS — M25612 Stiffness of left shoulder, not elsewhere classified: Secondary | ICD-10-CM | POA: Diagnosis not present

## 2022-12-14 DIAGNOSIS — M6281 Muscle weakness (generalized): Secondary | ICD-10-CM | POA: Diagnosis not present

## 2022-12-14 DIAGNOSIS — M25612 Stiffness of left shoulder, not elsewhere classified: Secondary | ICD-10-CM | POA: Diagnosis not present

## 2022-12-14 DIAGNOSIS — S42352D Displaced comminuted fracture of shaft of humerus, left arm, subsequent encounter for fracture with routine healing: Secondary | ICD-10-CM | POA: Diagnosis not present

## 2022-12-15 DIAGNOSIS — S42352D Displaced comminuted fracture of shaft of humerus, left arm, subsequent encounter for fracture with routine healing: Secondary | ICD-10-CM | POA: Diagnosis not present

## 2022-12-19 DIAGNOSIS — M25612 Stiffness of left shoulder, not elsewhere classified: Secondary | ICD-10-CM | POA: Diagnosis not present

## 2022-12-19 DIAGNOSIS — S42352D Displaced comminuted fracture of shaft of humerus, left arm, subsequent encounter for fracture with routine healing: Secondary | ICD-10-CM | POA: Diagnosis not present

## 2022-12-19 DIAGNOSIS — M6281 Muscle weakness (generalized): Secondary | ICD-10-CM | POA: Diagnosis not present

## 2022-12-21 DIAGNOSIS — M6281 Muscle weakness (generalized): Secondary | ICD-10-CM | POA: Diagnosis not present

## 2022-12-21 DIAGNOSIS — S42352D Displaced comminuted fracture of shaft of humerus, left arm, subsequent encounter for fracture with routine healing: Secondary | ICD-10-CM | POA: Diagnosis not present

## 2022-12-21 DIAGNOSIS — M25612 Stiffness of left shoulder, not elsewhere classified: Secondary | ICD-10-CM | POA: Diagnosis not present

## 2022-12-21 DIAGNOSIS — C61 Malignant neoplasm of prostate: Secondary | ICD-10-CM | POA: Diagnosis not present

## 2022-12-25 DIAGNOSIS — E1122 Type 2 diabetes mellitus with diabetic chronic kidney disease: Secondary | ICD-10-CM | POA: Diagnosis not present

## 2022-12-25 DIAGNOSIS — C61 Malignant neoplasm of prostate: Secondary | ICD-10-CM | POA: Diagnosis not present

## 2022-12-25 DIAGNOSIS — N1831 Chronic kidney disease, stage 3a: Secondary | ICD-10-CM | POA: Diagnosis not present

## 2022-12-25 DIAGNOSIS — I10 Essential (primary) hypertension: Secondary | ICD-10-CM | POA: Diagnosis not present

## 2022-12-26 DIAGNOSIS — S42352D Displaced comminuted fracture of shaft of humerus, left arm, subsequent encounter for fracture with routine healing: Secondary | ICD-10-CM | POA: Diagnosis not present

## 2022-12-26 DIAGNOSIS — M6281 Muscle weakness (generalized): Secondary | ICD-10-CM | POA: Diagnosis not present

## 2022-12-26 DIAGNOSIS — M25612 Stiffness of left shoulder, not elsewhere classified: Secondary | ICD-10-CM | POA: Diagnosis not present

## 2022-12-28 DIAGNOSIS — C61 Malignant neoplasm of prostate: Secondary | ICD-10-CM | POA: Diagnosis not present

## 2022-12-28 DIAGNOSIS — M25612 Stiffness of left shoulder, not elsewhere classified: Secondary | ICD-10-CM | POA: Diagnosis not present

## 2022-12-28 DIAGNOSIS — N390 Urinary tract infection, site not specified: Secondary | ICD-10-CM | POA: Diagnosis not present

## 2022-12-28 DIAGNOSIS — N393 Stress incontinence (female) (male): Secondary | ICD-10-CM | POA: Diagnosis not present

## 2022-12-28 DIAGNOSIS — M6281 Muscle weakness (generalized): Secondary | ICD-10-CM | POA: Diagnosis not present

## 2022-12-28 DIAGNOSIS — S42352D Displaced comminuted fracture of shaft of humerus, left arm, subsequent encounter for fracture with routine healing: Secondary | ICD-10-CM | POA: Diagnosis not present

## 2023-01-02 DIAGNOSIS — M25612 Stiffness of left shoulder, not elsewhere classified: Secondary | ICD-10-CM | POA: Diagnosis not present

## 2023-01-02 DIAGNOSIS — S42352D Displaced comminuted fracture of shaft of humerus, left arm, subsequent encounter for fracture with routine healing: Secondary | ICD-10-CM | POA: Diagnosis not present

## 2023-01-02 DIAGNOSIS — M6281 Muscle weakness (generalized): Secondary | ICD-10-CM | POA: Diagnosis not present

## 2023-01-04 DIAGNOSIS — S42352D Displaced comminuted fracture of shaft of humerus, left arm, subsequent encounter for fracture with routine healing: Secondary | ICD-10-CM | POA: Diagnosis not present

## 2023-01-04 DIAGNOSIS — M25612 Stiffness of left shoulder, not elsewhere classified: Secondary | ICD-10-CM | POA: Diagnosis not present

## 2023-01-04 DIAGNOSIS — M6281 Muscle weakness (generalized): Secondary | ICD-10-CM | POA: Diagnosis not present

## 2023-01-11 DIAGNOSIS — M25612 Stiffness of left shoulder, not elsewhere classified: Secondary | ICD-10-CM | POA: Diagnosis not present

## 2023-01-11 DIAGNOSIS — S42352D Displaced comminuted fracture of shaft of humerus, left arm, subsequent encounter for fracture with routine healing: Secondary | ICD-10-CM | POA: Diagnosis not present

## 2023-01-11 DIAGNOSIS — M6281 Muscle weakness (generalized): Secondary | ICD-10-CM | POA: Diagnosis not present

## 2023-01-16 DIAGNOSIS — M25612 Stiffness of left shoulder, not elsewhere classified: Secondary | ICD-10-CM | POA: Diagnosis not present

## 2023-01-16 DIAGNOSIS — M6281 Muscle weakness (generalized): Secondary | ICD-10-CM | POA: Diagnosis not present

## 2023-01-16 DIAGNOSIS — S42352D Displaced comminuted fracture of shaft of humerus, left arm, subsequent encounter for fracture with routine healing: Secondary | ICD-10-CM | POA: Diagnosis not present

## 2023-01-18 DIAGNOSIS — M25612 Stiffness of left shoulder, not elsewhere classified: Secondary | ICD-10-CM | POA: Diagnosis not present

## 2023-01-18 DIAGNOSIS — S42352D Displaced comminuted fracture of shaft of humerus, left arm, subsequent encounter for fracture with routine healing: Secondary | ICD-10-CM | POA: Diagnosis not present

## 2023-01-18 DIAGNOSIS — M6281 Muscle weakness (generalized): Secondary | ICD-10-CM | POA: Diagnosis not present

## 2023-01-23 DIAGNOSIS — M25612 Stiffness of left shoulder, not elsewhere classified: Secondary | ICD-10-CM | POA: Diagnosis not present

## 2023-01-23 DIAGNOSIS — M6281 Muscle weakness (generalized): Secondary | ICD-10-CM | POA: Diagnosis not present

## 2023-01-23 DIAGNOSIS — S42352D Displaced comminuted fracture of shaft of humerus, left arm, subsequent encounter for fracture with routine healing: Secondary | ICD-10-CM | POA: Diagnosis not present

## 2023-01-25 ENCOUNTER — Other Ambulatory Visit (HOSPITAL_COMMUNITY): Payer: Self-pay | Admitting: Urology

## 2023-01-25 DIAGNOSIS — C61 Malignant neoplasm of prostate: Secondary | ICD-10-CM

## 2023-01-25 DIAGNOSIS — M25612 Stiffness of left shoulder, not elsewhere classified: Secondary | ICD-10-CM | POA: Diagnosis not present

## 2023-01-25 DIAGNOSIS — M6281 Muscle weakness (generalized): Secondary | ICD-10-CM | POA: Diagnosis not present

## 2023-01-25 DIAGNOSIS — S42352D Displaced comminuted fracture of shaft of humerus, left arm, subsequent encounter for fracture with routine healing: Secondary | ICD-10-CM | POA: Diagnosis not present

## 2023-01-30 DIAGNOSIS — M25612 Stiffness of left shoulder, not elsewhere classified: Secondary | ICD-10-CM | POA: Diagnosis not present

## 2023-01-30 DIAGNOSIS — M6281 Muscle weakness (generalized): Secondary | ICD-10-CM | POA: Diagnosis not present

## 2023-01-30 DIAGNOSIS — S42352D Displaced comminuted fracture of shaft of humerus, left arm, subsequent encounter for fracture with routine healing: Secondary | ICD-10-CM | POA: Diagnosis not present

## 2023-02-05 ENCOUNTER — Ambulatory Visit (HOSPITAL_COMMUNITY)
Admission: RE | Admit: 2023-02-05 | Discharge: 2023-02-05 | Disposition: A | Discharge status: Home / Self Care | Payer: Medicare Other | Source: Ambulatory Visit | Attending: Urology | Admitting: Urology

## 2023-02-05 DIAGNOSIS — Z8546 Personal history of malignant neoplasm of prostate: Secondary | ICD-10-CM | POA: Diagnosis not present

## 2023-02-05 DIAGNOSIS — C61 Malignant neoplasm of prostate: Secondary | ICD-10-CM | POA: Insufficient documentation

## 2023-02-05 MED ORDER — TECHNETIUM TC 99M MEDRONATE IV KIT
20.0000 | PACK | Freq: Once | INTRAVENOUS | Status: AC | PRN
  Administered 2023-02-05: 20.2 via INTRAVENOUS

## 2023-02-06 DIAGNOSIS — M6281 Muscle weakness (generalized): Secondary | ICD-10-CM | POA: Diagnosis not present

## 2023-02-06 DIAGNOSIS — M25612 Stiffness of left shoulder, not elsewhere classified: Secondary | ICD-10-CM | POA: Diagnosis not present

## 2023-02-06 DIAGNOSIS — S42352D Displaced comminuted fracture of shaft of humerus, left arm, subsequent encounter for fracture with routine healing: Secondary | ICD-10-CM | POA: Diagnosis not present

## 2023-02-08 DIAGNOSIS — M25612 Stiffness of left shoulder, not elsewhere classified: Secondary | ICD-10-CM | POA: Diagnosis not present

## 2023-02-08 DIAGNOSIS — M6281 Muscle weakness (generalized): Secondary | ICD-10-CM | POA: Diagnosis not present

## 2023-02-08 DIAGNOSIS — S42352D Displaced comminuted fracture of shaft of humerus, left arm, subsequent encounter for fracture with routine healing: Secondary | ICD-10-CM | POA: Diagnosis not present

## 2023-02-09 DIAGNOSIS — S42352D Displaced comminuted fracture of shaft of humerus, left arm, subsequent encounter for fracture with routine healing: Secondary | ICD-10-CM | POA: Diagnosis not present

## 2023-02-13 DIAGNOSIS — S42352D Displaced comminuted fracture of shaft of humerus, left arm, subsequent encounter for fracture with routine healing: Secondary | ICD-10-CM | POA: Diagnosis not present

## 2023-02-13 DIAGNOSIS — M6281 Muscle weakness (generalized): Secondary | ICD-10-CM | POA: Diagnosis not present

## 2023-02-13 DIAGNOSIS — M25612 Stiffness of left shoulder, not elsewhere classified: Secondary | ICD-10-CM | POA: Diagnosis not present

## 2023-02-15 DIAGNOSIS — M6281 Muscle weakness (generalized): Secondary | ICD-10-CM | POA: Diagnosis not present

## 2023-02-15 DIAGNOSIS — M25612 Stiffness of left shoulder, not elsewhere classified: Secondary | ICD-10-CM | POA: Diagnosis not present

## 2023-02-15 DIAGNOSIS — S42352D Displaced comminuted fracture of shaft of humerus, left arm, subsequent encounter for fracture with routine healing: Secondary | ICD-10-CM | POA: Diagnosis not present

## 2023-02-21 DIAGNOSIS — M25612 Stiffness of left shoulder, not elsewhere classified: Secondary | ICD-10-CM | POA: Diagnosis not present

## 2023-02-21 DIAGNOSIS — S42352D Displaced comminuted fracture of shaft of humerus, left arm, subsequent encounter for fracture with routine healing: Secondary | ICD-10-CM | POA: Diagnosis not present

## 2023-02-21 DIAGNOSIS — M6281 Muscle weakness (generalized): Secondary | ICD-10-CM | POA: Diagnosis not present

## 2023-02-27 DIAGNOSIS — M25612 Stiffness of left shoulder, not elsewhere classified: Secondary | ICD-10-CM | POA: Diagnosis not present

## 2023-02-27 DIAGNOSIS — M6281 Muscle weakness (generalized): Secondary | ICD-10-CM | POA: Diagnosis not present

## 2023-02-27 DIAGNOSIS — S42352D Displaced comminuted fracture of shaft of humerus, left arm, subsequent encounter for fracture with routine healing: Secondary | ICD-10-CM | POA: Diagnosis not present

## 2023-03-01 DIAGNOSIS — M6281 Muscle weakness (generalized): Secondary | ICD-10-CM | POA: Diagnosis not present

## 2023-03-01 DIAGNOSIS — M25612 Stiffness of left shoulder, not elsewhere classified: Secondary | ICD-10-CM | POA: Diagnosis not present

## 2023-03-01 DIAGNOSIS — S42352D Displaced comminuted fracture of shaft of humerus, left arm, subsequent encounter for fracture with routine healing: Secondary | ICD-10-CM | POA: Diagnosis not present

## 2023-03-07 DIAGNOSIS — S42352D Displaced comminuted fracture of shaft of humerus, left arm, subsequent encounter for fracture with routine healing: Secondary | ICD-10-CM | POA: Diagnosis not present

## 2023-03-07 DIAGNOSIS — M25612 Stiffness of left shoulder, not elsewhere classified: Secondary | ICD-10-CM | POA: Diagnosis not present

## 2023-03-07 DIAGNOSIS — M6281 Muscle weakness (generalized): Secondary | ICD-10-CM | POA: Diagnosis not present

## 2023-03-14 DIAGNOSIS — M6281 Muscle weakness (generalized): Secondary | ICD-10-CM | POA: Diagnosis not present

## 2023-03-14 DIAGNOSIS — S42352D Displaced comminuted fracture of shaft of humerus, left arm, subsequent encounter for fracture with routine healing: Secondary | ICD-10-CM | POA: Diagnosis not present

## 2023-03-14 DIAGNOSIS — M25612 Stiffness of left shoulder, not elsewhere classified: Secondary | ICD-10-CM | POA: Diagnosis not present

## 2023-03-29 DIAGNOSIS — C61 Malignant neoplasm of prostate: Secondary | ICD-10-CM | POA: Diagnosis not present

## 2023-04-05 DIAGNOSIS — N393 Stress incontinence (female) (male): Secondary | ICD-10-CM | POA: Diagnosis not present

## 2023-04-05 DIAGNOSIS — C61 Malignant neoplasm of prostate: Secondary | ICD-10-CM | POA: Diagnosis not present

## 2023-04-09 DIAGNOSIS — C61 Malignant neoplasm of prostate: Secondary | ICD-10-CM | POA: Diagnosis not present

## 2023-04-09 DIAGNOSIS — N1831 Chronic kidney disease, stage 3a: Secondary | ICD-10-CM | POA: Diagnosis not present

## 2023-04-09 DIAGNOSIS — E1122 Type 2 diabetes mellitus with diabetic chronic kidney disease: Secondary | ICD-10-CM | POA: Diagnosis not present

## 2023-05-11 DIAGNOSIS — S42352D Displaced comminuted fracture of shaft of humerus, left arm, subsequent encounter for fracture with routine healing: Secondary | ICD-10-CM | POA: Diagnosis not present

## 2023-05-31 DIAGNOSIS — M1990 Unspecified osteoarthritis, unspecified site: Secondary | ICD-10-CM | POA: Diagnosis not present

## 2023-05-31 DIAGNOSIS — E7800 Pure hypercholesterolemia, unspecified: Secondary | ICD-10-CM | POA: Diagnosis not present

## 2023-05-31 DIAGNOSIS — M899 Disorder of bone, unspecified: Secondary | ICD-10-CM | POA: Diagnosis not present

## 2023-05-31 DIAGNOSIS — I119 Hypertensive heart disease without heart failure: Secondary | ICD-10-CM | POA: Diagnosis not present

## 2023-05-31 DIAGNOSIS — E119 Type 2 diabetes mellitus without complications: Secondary | ICD-10-CM | POA: Diagnosis not present

## 2023-05-31 DIAGNOSIS — R109 Unspecified abdominal pain: Secondary | ICD-10-CM | POA: Diagnosis not present

## 2023-07-11 DIAGNOSIS — C61 Malignant neoplasm of prostate: Secondary | ICD-10-CM | POA: Diagnosis not present

## 2023-07-11 DIAGNOSIS — E119 Type 2 diabetes mellitus without complications: Secondary | ICD-10-CM | POA: Diagnosis not present

## 2023-07-19 DIAGNOSIS — C61 Malignant neoplasm of prostate: Secondary | ICD-10-CM | POA: Diagnosis not present

## 2023-07-23 DIAGNOSIS — C61 Malignant neoplasm of prostate: Secondary | ICD-10-CM | POA: Diagnosis not present

## 2023-07-26 DIAGNOSIS — N393 Stress incontinence (female) (male): Secondary | ICD-10-CM | POA: Diagnosis not present

## 2023-07-26 DIAGNOSIS — C778 Secondary and unspecified malignant neoplasm of lymph nodes of multiple regions: Secondary | ICD-10-CM | POA: Diagnosis not present

## 2023-07-26 DIAGNOSIS — C61 Malignant neoplasm of prostate: Secondary | ICD-10-CM | POA: Diagnosis not present

## 2023-08-01 ENCOUNTER — Other Ambulatory Visit (HOSPITAL_COMMUNITY): Payer: Self-pay | Admitting: Urology

## 2023-08-01 DIAGNOSIS — C778 Secondary and unspecified malignant neoplasm of lymph nodes of multiple regions: Secondary | ICD-10-CM

## 2023-08-02 NOTE — Progress Notes (Signed)
-----   Message -----  From: Simonne Come, MD  Sent: 08/02/2023  12:19 PM EDT  To: Caroleen Hamman, NT  Subject: RE: [place] CORE BIOPSY (LYMPH NODES)               OK for CT guided L RP Node Bx.   *Foundation one genetic testing. Please request foundation tumor genetic testing*   CT AP - 07/23/23 -  image 40, series 2.   Nedra Hai

## 2023-08-09 NOTE — Progress Notes (Signed)
GU Location of Tumor / Histology: Prostate Ca (recurrent biochemically)  Prostatectomy (2009)  PSA  73.30 on 07/20/2023 PSA  33.40 on 03/29/2023  Biopsies    02/05/2023 Dr. Sebastian Ache NM Bone Scan Whole Body CLINICAL DATA: History of prostate cancer   lIMPRESSION: 1. No evidence of bony metastatic disease. 2. Multifocal degenerative and chronic posttraumatic changes as above.   Past/Anticipated interventions by urology, if any:     Past/Anticipated interventions by medical oncology, if any: NA  Weight changes, if any:  Weight loss of 6 lbs.  IPSS:  15 SHIM:  6  Bowel/Bladder complaints, if any:  No  Nausea/Vomiting, if any: No  Pain issues, if any:  0/10  SAFETY ISSUES: Prior radiation? Yes, prostate Pacemaker/ICD? No Possible current pregnancy? Male Is the patient on methotrexate? No  Current Complaints / other details:

## 2023-08-12 NOTE — Progress Notes (Signed)
Radiation Oncology         [phone] ________________________________  Initial Outpatient Consultation  Name: Ian Dudley MRN: [id number]  Date: 08/13/2023  DOB: Apr 07, 1941  WU:[id number], Greggory Stallion, MD  Loletta Parish., *   REFERRING PHYSICIAN: Loletta Parish., *  DIAGNOSIS: 82 y.o. gentleman with a PSA of 73.3; s/p RALP 01/2008 for Stage pT3aN0, Gleason 4+4 prostate cancer    ICD-10-CM   1. Malignant neoplasm of prostate (HCC)  C61 NM PET (PSMA) SKULL TO MID THIGH      HISTORY OF PRESENT ILLNESS: Ian Dudley is a 82 y.o. male with a diagnosis of recurrent prostate cancer. He was initially diagnosed with Gleason 3+4 prostate cancer on 10/15/07. His PSA in 12/2007 was up to 21. He opted to proceed with RALP on 01/22/08 under Dr. Merilynn Finland at [facility]. Pathology revealed Gleason 4+4 prostatic adenocarcinoma with extracapsular extension on right side, negative margins, extensive perineural invasion, and no seminal vesicle invasion. His postoperative PSA was undetectable. Unfortunately, his PSA quickly became detectable at 0.1 in 12/2008 and 0.2 in 07/2009. He was referred to Dr. Dayton Scrape and ultimately received salvage radiation therapy to the prostatectomy bed. He was also enrolled in the EMBARK enzalutamide trial through 2021.   Since that time, he was switched to ADT with Eligard. Initially, his PSA responded well and dropped to 0.48. However, this rose again to 4.26 in 11/2021 and 12.2 in 03/2022. He was subsequently started on bicalutamide Diana Eves) in 03/2022. Per notes from Dr. Berneice Heinrich, he could not take Xtandi continuously due to cost and availability. This caused a fluctuation in his PSA levels. Overall, his PSA has continued to rise, to 26 in 12/2022, 33 in 03/2023, and 73.3 in 07/2023. This prompted a restaging CT A/P on 07/23/23 showing: new pathologic retrocrural and para-aortic adenopathy, with enlarging lower thoracic para-aortic adenopathy; no definite pathologic adenopathy in  pelvis. He is scheduled for biopsy of the peri-aortic lymph nodes on 08/15/2023 and PSMA PET on 08/21/2023.   The patient reviewed the biopsy results with his urologist and he has kindly been referred today for discussion of potential radiation treatment options.   PREVIOUS RADIATION THERAPY: Yes  2010: Prostatic fossa (Dr. 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    Insect bite    Localized swelling, mass and lump, unspecified lower limb    Myalgia    Osteoarthritis    Other forms of dyspnea    Other long term (current) drug therapy    Pain in right knee    Personal history of malignant neoplasm of prostate    Prostate cancer (HCC)    Pure hypercholesterolemia    Secondary cataract       PAST SURGICAL HISTORY: Past Surgical History:  Procedure Laterality Date   HUMERUS IM NAIL Left 11/15/2022   Procedure: INTRAMEDULLARY (IM) NAIL HUMERAL;  Surgeon: Bjorn Pippin, MD;  Location: [facility];  Service: Orthopedics;  Laterality: Left;   PROSTATE BIOPSY     PROSTATECTOMY  TONSILLECTOMY      FAMILY HISTORY:  Family History  Problem Relation Age of Onset   Hypertension Mother    Diabetes Mother    Kidney failure Mother    Breast cancer Sister    Lupus Sister    Colon cancer Brother    Prostate cancer Brother    Colon cancer Sister    Healthy Sister    Healthy Brother    Healthy Brother     SOCIAL HISTORY:  Social History   Socioeconomic History   Marital status: Married    Spouse name: Not on file   Number of children: Not on file   Years of education: Not on file   Highest  education level: Not on file  Occupational History   Not on file  Tobacco Use   Smoking status: Former   Smokeless tobacco: Never  Vaping Use   Vaping status: Never Used  Substance and Sexual Activity   Alcohol use: No    Alcohol/week: 0.0 standard drinks of alcohol   Drug use: No   Sexual activity: Not on file  Other Topics Concern   Not on file  Social History Narrative   Not on file   Social Determinants of Health   Financial Resource Strain: Not on file  Food Insecurity: No Food Insecurity (08/13/2023)   Hunger Vital Sign    Worried About Running Out of Food in the Last Year: Never true    Ran Out of Food in the Last Year: Never true  Transportation Needs: No Transportation Needs (08/13/2023)   PRAPARE - Administrator, Civil Service (Medical): No    Lack of Transportation (Non-Medical): No  Physical Activity: Not on file  Stress: Not on file  Social Connections: Unknown (04/18/2022)   Received from [facility]   Social Network    Social Network: Not on file  Intimate Partner Violence: Not At Risk (08/13/2023)   Humiliation, Afraid, Rape, and Kick questionnaire    Fear of Current or Ex-Partner: No    Emotionally Abused: No    Physically Abused: No    Sexually Abused: No    ALLERGIES: Patient has no known allergies.  MEDICATIONS:  Current Outpatient Medications  Medication Sig Dispense Refill   acetaminophen (TYLENOL) 500 MG tablet Take 500 mg by mouth every 6 (six) hours as needed.     glipiZIDE (GLUCOTROL XL) 10 MG 24 hr tablet Take 10 mg by mouth 4 (four) times a week.     lisinopril-hydrochlorothiazide (PRINZIDE,ZESTORETIC) 10-12.5 MG tablet Take 1 tablet by mouth daily. THIS IS THE LAST AUTHORIZED REFILL FOR THIS MEDICATION.  Please have future refills authorized by PCP 30 tablet 0   metFORMIN (GLUCOPHAGE-XR) 500 MG 24 hr tablet Take 500 mg by mouth daily.     pioglitazone (ACTOS) 30 MG tablet Take 30 mg by mouth daily.     simvastatin (ZOCOR) 10 MG  tablet Take 10 mg by mouth daily.     XTANDI 40 MG tablet Take 160 mg by mouth daily.     No current facility-administered medications for this encounter.    REVIEW OF SYSTEMS:  On review of systems, the patient reports that he is doing well overall. He denies any chest pain, shortness of breath, cough, fevers, chills, night sweats, unintended weight changes. He denies any bowel disturbances, and denies abdominal pain, nausea or vomiting. He denies any new musculoskeletal or joint aches or pains. His IPSS was 16, indicating moderate urinary symptoms. His SHIM was  6, indicating he likely has erectile dysfunction. A complete review of systems is obtained and is otherwise negative.    PHYSICAL EXAM:  Wt Readings from Last 3 Encounters:  08/13/23 213 lb (96.6 kg)  11/15/22 218 lb 4.1 oz (99 kg)  11/11/22 220 lb (99.8 kg)   Temp Readings from Last 3 Encounters:  08/13/23 97.7 F (36.5 C) (Oral)  11/15/22 97.6 F (36.4 C) (Oral)  11/11/22 97.7 F (36.5 C) (Oral)   BP Readings from Last 3 Encounters:  08/13/23 (!) 140/59  11/15/22 (!) 122/55  11/11/22 (!) 131/51   Pulse Readings from Last 3 Encounters:  08/13/23 72  11/15/22 71  11/11/22 64   Pain Assessment Pain Score: 0-No pain/10  In general this is a well appearing man in no acute distress. He's alert and oriented x4 and appropriate throughout the examination. Cardiopulmonary assessment is negative for acute distress, and he exhibits normal effort.     KPS = 90  100 - Normal; no complaints; no evidence of disease. 90   - Able to carry on normal activity; minor signs or symptoms of disease. 80   - Normal activity with effort; some signs or symptoms of disease. 52   - Cares for self; unable to carry on normal activity or to do active work. 60   - Requires occasional assistance, but is able to care for most of his personal needs. 50   - Requires considerable assistance and frequent medical care. 40   - Disabled; requires  special care and assistance. 30   - Severely disabled; hospital admission is indicated although death not imminent. 20   - Very sick; hospital admission necessary; active supportive treatment necessary. 10   - Moribund; fatal processes progressing rapidly. 0     - Dead  Karnofsky DA, Abelmann WH, Craver LS and Burchenal [facility] [phone]) The use of the nitrogen mustards in the palliative treatment of carcinoma: with particular reference to bronchogenic carcinoma Cancer 1 634-56  LABORATORY DATA:  Lab Results  Component Value Date   WBC 4.5 11/15/2022   HGB 10.4 (L) 11/15/2022   HCT 33.9 (L) 11/15/2022   MCV 103.4 (H) 11/15/2022   PLT 195 11/15/2022   Lab Results  Component Value Date   NA 143 11/15/2022   K 4.3 11/15/2022   CL 108 11/15/2022   CO2 27 11/15/2022   No results found for: "ALT", "AST", "GGT", "ALKPHOS", "BILITOT"   RADIOGRAPHY: No results found.    IMPRESSION/PLAN: 1. 82 y.o. gentleman with a PSA of 73.3; s/p RALP 01/2008 for Stage pT3aN0, Gleason 4+4 prostate cancer  Today, we talked to the patient and family about the findings and workup thus far. Current imaging suggests retrocrural and para-aortic adenopathy, along with enlarging lower thoracic para-aortic adenopathy. A PET scan will be helpful to ruling out any additional metastases. If PET imaging shows additional metastatic disease, the patient may not be an ideal candidate for targeted radiation. However, if the disease is limited to the visual lymphadenopathy, he is a good candidate for stereotactic body radiotherapy (SBRT). We will await PET results before making a final treatment decision.   We discussed the natural history of prostate cancer and general treatment, highlighting the role of radiotherapy in the management of isolated metastatic disease to the peri-aortic nodes. We discussed the available radiation techniques, and focused on the details and logistics of delivery. We reviewed the anticipated acute and late  sequelae associated with radiation in this setting. The patient was encouraged to  ask questions that were answered to his satisfaction.  We are scheduled to see the patient back on 08/27/23 to discuss the PET results and finalize a treatment plan. We will share our discussion today with Manny.   We spent 60 minutes face to face with the patient and more than 50% of that time was spent in counseling and/or coordination of care.     Joyice Faster, PA-C    Margaretmary Dys, MD  [facility]  Radiation Oncology Direct Dial: [phone]  Fax: [phone] [email or web address]  Skype  [email or web address]   This document serves as a record of services personally performed by Margaretmary Dys, MD and Joyice Faster, PA-C. It was created on their behalf by Mickie Bail, a trained medical scribe. The creation of this record is based on the scribe's personal observations and the provider's statements to them. This document has been checked and approved by the attending provider.

## 2023-08-13 ENCOUNTER — Ambulatory Visit
Admission: RE | Admit: 2023-08-13 | Discharge: 2023-08-13 | Disposition: A | Discharge status: Home / Self Care | Payer: Medicare Other | Source: Ambulatory Visit | Attending: Radiation Oncology | Admitting: Radiation Oncology

## 2023-08-13 ENCOUNTER — Encounter: Payer: Self-pay | Admitting: Radiation Oncology

## 2023-08-13 ENCOUNTER — Other Ambulatory Visit: Payer: Self-pay

## 2023-08-13 VITALS — BP 140/59 | HR 72 | Temp 97.7°F | Resp 20 | Ht 67.0 in | Wt 213.0 lb

## 2023-08-13 DIAGNOSIS — C61 Malignant neoplasm of prostate: Secondary | ICD-10-CM

## 2023-08-13 DIAGNOSIS — Z191 Hormone sensitive malignancy status: Secondary | ICD-10-CM | POA: Diagnosis not present

## 2023-08-13 NOTE — Progress Notes (Signed)
Introduced myself to the patient as the prostate nurse navigator.  He is here to discuss his radiation treatment options, and per MD recommendations patient will need PSMA PET prior to finalizing treatment plan.  Patient is scheduled for 9/17 at 3pm, and will proceed with biopsy on 9/11.  Will coordinate for follow up post scan to ensure treatment plan is finalized.  I gave him my business card and asked him to call me with questions or concerns.  Verbalized understanding.

## 2023-08-14 ENCOUNTER — Other Ambulatory Visit: Payer: Self-pay | Admitting: Radiology

## 2023-08-14 ENCOUNTER — Ambulatory Visit: Payer: Medicare Other | Admitting: Radiation Oncology

## 2023-08-14 DIAGNOSIS — R591 Generalized enlarged lymph nodes: Secondary | ICD-10-CM

## 2023-08-15 ENCOUNTER — Encounter (HOSPITAL_COMMUNITY): Payer: Self-pay

## 2023-08-15 ENCOUNTER — Other Ambulatory Visit (HOSPITAL_COMMUNITY): Payer: Self-pay | Admitting: Urology

## 2023-08-15 ENCOUNTER — Ambulatory Visit (HOSPITAL_COMMUNITY)
Admission: RE | Admit: 2023-08-15 | Discharge: 2023-08-15 | Disposition: A | Discharge status: Home / Self Care | Payer: Medicare Other | Source: Ambulatory Visit | Attending: Urology | Admitting: Urology

## 2023-08-15 DIAGNOSIS — C778 Secondary and unspecified malignant neoplasm of lymph nodes of multiple regions: Secondary | ICD-10-CM

## 2023-08-15 DIAGNOSIS — R591 Generalized enlarged lymph nodes: Secondary | ICD-10-CM | POA: Diagnosis not present

## 2023-08-15 DIAGNOSIS — Z8546 Personal history of malignant neoplasm of prostate: Secondary | ICD-10-CM | POA: Diagnosis not present

## 2023-08-15 DIAGNOSIS — C772 Secondary and unspecified malignant neoplasm of intra-abdominal lymph nodes: Secondary | ICD-10-CM | POA: Diagnosis not present

## 2023-08-15 DIAGNOSIS — R1909 Other intra-abdominal and pelvic swelling, mass and lump: Secondary | ICD-10-CM | POA: Diagnosis not present

## 2023-08-15 LAB — GLUCOSE, CAPILLARY
Glucose-Capillary: 76 mg/dL (ref 70–99)
Glucose-Capillary: 56 mg/dL — ABNORMAL LOW (ref 70–99)
Glucose-Capillary: 94 mg/dL (ref 70–99)
Glucose-Capillary: 75 mg/dL (ref 70–99)
Glucose-Capillary: 71 mg/dL (ref 70–99)
Glucose-Capillary: 68 mg/dL — ABNORMAL LOW (ref 70–99)

## 2023-08-15 LAB — CBC
HCT: 34.3 % — ABNORMAL LOW (ref 39.0–52.0)
Hemoglobin: 10.9 g/dL — ABNORMAL LOW (ref 13.0–17.0)
MCH: 32.2 pg (ref 26.0–34.0)
MCHC: 31.8 g/dL (ref 30.0–36.0)
MCV: 101.5 fL — ABNORMAL HIGH (ref 80.0–100.0)
Platelets: 240 10*3/uL (ref 150–400)
RBC: 3.38 MIL/uL — ABNORMAL LOW (ref 4.22–5.81)
RDW: 15.5 % (ref 11.5–15.5)
WBC: 4.1 10*3/uL (ref 4.0–10.5)
nRBC: 0 % (ref 0.0–0.2)

## 2023-08-15 LAB — PROTIME-INR
INR: 1 (ref 0.8–1.2)
Prothrombin Time: 13 s (ref 11.4–15.2)

## 2023-08-15 MED ORDER — FENTANYL CITRATE (PF) 100 MCG/2ML IJ SOLN
INTRAMUSCULAR | Status: AC | PRN
Start: 2023-08-15 — End: 2023-08-15
  Administered 2023-08-15: 50 ug via INTRAVENOUS

## 2023-08-15 MED ORDER — DEXTROSE 50 % IV SOLN: 12.5000 g | INTRAVENOUS | Status: AC

## 2023-08-15 MED ORDER — MIDAZOLAM HCL 2 MG/2ML IJ SOLN
INTRAMUSCULAR | Status: AC
  Filled 2023-08-15: qty 2

## 2023-08-15 MED ORDER — MIDAZOLAM HCL 2 MG/2ML IJ SOLN
INTRAMUSCULAR | Status: AC | PRN
Start: 2023-08-15 — End: 2023-08-15
  Administered 2023-08-15: 1 mg via INTRAVENOUS

## 2023-08-15 MED ORDER — FENTANYL CITRATE (PF) 100 MCG/2ML IJ SOLN
INTRAMUSCULAR | Status: AC
  Filled 2023-08-15: qty 2

## 2023-08-15 MED ORDER — SODIUM CHLORIDE 0.9 % IV SOLN: INTRAVENOUS | Status: DC

## 2023-08-15 MED ORDER — LIDOCAINE-EPINEPHRINE 1 %-1:100000 IJ SOLN
10.0000 mL | Freq: Once | INTRAMUSCULAR | Status: AC
  Administered 2023-08-15: 10 mL via INTRADERMAL

## 2023-08-15 MED ORDER — DEXTROSE 50 % IV SOLN
INTRAVENOUS | Status: AC
  Filled 2023-08-15: qty 50

## 2023-08-15 NOTE — H&P (Signed)
Chief Complaint: Patient was seen in consultation today for Left retroperitoneal lymph node biopsy at the request of Manny,Theodore B Jr.  Referring Physician(s): Manny,Theodore B Jr.  Supervising Physician: Simonne Come  Patient Status: [facility] - Out-pt  History of Present Illness: Ian Dudley is a 82 y.o. male   FULL Code status per pt  Known Hx Prostate cancer Follows with Dr Berneice Heinrich  CT 07/25/23:IMPRESSION: 1. New pathologic retrocrural and para-aortic adenopathy, with enlarging lower thoracic para-aortic adenopathy, suspicious for active malignancy. No definite pathologic adenopathy in the pelvis; prior pelvic lymph node dissection and prior prostatectomy. 2. Low-density blood pool raises the possibility of anemia. 3. Aortic and systemic atherosclerosis. 4. Lumbar spondylosis and degenerative disc disease with multilevel foraminal impingement. 5. Mild superior endplate compression of L4 with about 45% loss of height, new compared to 01/07/2021 although probably chronic or late subacute. 6. Small supraumbilical hernia contains adipose tissue.  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    Insect bite    Localized swelling, mass and lump, unspecified lower limb    Myalgia    Osteoarthritis    Other forms of dyspnea    Other long term (current) drug therapy    Pain in right knee    Personal history of malignant neoplasm of prostate    Prostate cancer (HCC)    Pure hypercholesterolemia    Secondary cataract      Past Surgical History:  Procedure Laterality Date   HUMERUS IM NAIL Left 11/15/2022   Procedure: INTRAMEDULLARY (IM) NAIL HUMERAL;  Surgeon: Bjorn Pippin, MD;  Location: [facility];  Service: Orthopedics;  Laterality: Left;   PROSTATE BIOPSY     PROSTATECTOMY     TONSILLECTOMY      Allergies: Patient has no known allergies.  Medications: Prior to Admission medications   Medication Sig Start Date End Date Taking? Authorizing Provider  glipiZIDE (GLUCOTROL XL) 10 MG 24 hr tablet Take 10 mg by mouth 4 (four) times a week. 10/14/22  Yes [provider]  lisinopril-hydrochlorothiazide (PRINZIDE,ZESTORETIC) 10-12.5 MG tablet Take 1 tablet by mouth daily. THIS IS THE LAST AUTHORIZED REFILL FOR THIS MEDICATION.  Please have future refills authorized by PCP 03/07/17  Yes Lyn Records, MD  metFORMIN (GLUCOPHAGE-XR) 500 MG 24 hr tablet Take 500 mg by mouth daily. 05/18/23  Yes [provider]  pioglitazone (ACTOS) 30 MG tablet Take 30 mg by mouth daily.   Yes [provider]  simvastatin (ZOCOR) 10 MG tablet Take 10 mg by mouth daily.   Yes [provider]  XTANDI 40 MG tablet Take 160 mg by mouth daily. 11/07/22  Yes [provider]  acetaminophen (TYLENOL) 500 MG tablet Take 500 mg by mouth every 6 (six) hours as needed.    [provider]     Family History  Problem Relation Age of Onset   Hypertension Mother    Diabetes Mother    Kidney failure Mother  Breast cancer Sister    Lupus Sister    Colon cancer Brother    Prostate cancer Brother    Colon cancer Sister    Healthy Sister    Healthy Brother    Healthy Brother     Social History   Socioeconomic History   Marital status: Married    Spouse name: Not on file   Number of children: Not on file   Years of education: Not on file   Highest education level: Not on file  Occupational History   Not on file  Tobacco Use   Smoking status: Former   Smokeless tobacco: Never   Vaping Use   Vaping status: Never Used  Substance and Sexual Activity   Alcohol use: No    Alcohol/week: 0.0 standard drinks of alcohol   Drug use: No   Sexual activity: Not on file  Other Topics Concern   Not on file  Social History Narrative   Not on file   Social Determinants of Health   Financial Resource Strain: Not on file  Food Insecurity: No Food Insecurity (08/13/2023)   Hunger Vital Sign    Worried About Running Out of Food in the Last Year: Never true    Ran Out of Food in the Last Year: Never true  Transportation Needs: No Transportation Needs (08/13/2023)   PRAPARE - Administrator, Civil Service (Medical): No    Lack of Transportation (Non-Medical): No  Physical Activity: Not on file  Stress: Not on file  Social Connections: Unknown (04/18/2022)   Received from [facility]   Social Network    Social Network: Not on file    Review of Systems: A 12 point ROS discussed and pertinent positives are indicated in the HPI above.  All other systems are negative.  Review of Systems  Constitutional:  Negative for activity change, fatigue and fever.  Respiratory:  Negative for cough and shortness of breath.   Cardiovascular:  Negative for chest pain.  Gastrointestinal:  Negative for abdominal pain.  Psychiatric/Behavioral:  Negative for behavioral problems and confusion.     Vital Signs: BP (!) 131/48   Pulse 77   Temp (!) 97.2 F (36.2 C) (Temporal)   Resp 18   Ht 5\' 7"  (1.702 m)   Wt 208 lb (94.3 kg)   SpO2 100%   BMI 32.58 kg/m   Advance Care Plan: The advanced care plan/surrogate decision maker was discussed at the time of visit and documented in the medical record.    Physical Exam Vitals reviewed.  HENT:     Mouth/Throat:     Mouth: Mucous membranes are moist.  Cardiovascular:     Rate and Rhythm: Normal rate and regular rhythm.     Heart sounds: Normal heart sounds.  Pulmonary:     Effort: Pulmonary effort is normal.     Breath  sounds: Normal breath sounds. No wheezing.  Abdominal:     Palpations: Abdomen is soft.  Musculoskeletal:        General: Normal range of motion.  Skin:    General: Skin is warm.  Neurological:     Mental Status: He is alert and oriented to person, place, and time.  Psychiatric:        Behavior: Behavior normal.     Imaging: No results found.  Labs:  CBC: Recent Labs    11/15/22 1110 08/15/23 0811  WBC 4.5 4.1  HGB 10.4* 10.9*  HCT 33.9* 34.3*  PLT 195 240  COAGS: Recent Labs    08/15/23 0811  INR 1.0    BMP: Recent Labs    11/15/22 0640  NA 143  K 4.3  CL 108  CO2 27  GLUCOSE 120*  BUN 30*  CALCIUM 9.0  CREATININE 1.55*  GFRNONAA 44*    LIVER FUNCTION TESTS: No results for input(s): "BILITOT", "AST", "ALT", "ALKPHOS", "PROT", "ALBUMIN" in the last 8760 hours.  TUMOR MARKERS: No results for input(s): "AFPTM", "CEA", "CA199", "CHROMGRNA" in the last 8760 hours.  Assessment and Plan:  Scheduled for left retroperitoneal lymph node biopsy Risks and benefits of left retroperitoneal lymph node biopsy was discussed with the patient and/or patient's family including, but not limited to bleeding, infection, damage to adjacent structures or low yield requiring additional tests.  All of the questions were answered and there is agreement to proceed.  Consent signed and in chart.  Thank you for this interesting consult.  I greatly enjoyed meeting VOSHON KERWOOD and look forward to participating in their care.  A copy of this report was sent to the requesting provider on this date.  Electronically Signed: Robet Leu, PA-C 08/15/2023, 9:11 AM   I spent a total of  30 Minutes   in face to face in clinical consultation, greater than 50% of which was counseling/coordinating care for left RP LN bx

## 2023-08-15 NOTE — Progress Notes (Signed)
Date and time results received: 08/15/23 0910   Test: CBG Critical Value: 57   Orders Received? Or Actions Taken?:  Hypoglycemia protocol followed. 1/2 Amp D50 given. Radiology Charge RN notified and she stated she would re-check CBG once he got down to her. Patient leaving Short Stay to go to radiology now for procedure. No hypoglycemia symptoms reported by patient.

## 2023-08-15 NOTE — Procedures (Signed)
Pre procedural Dx: Prostate cancer, now with retroperitoneal lymphadenopathy Post procedural Dx: Same  Technically successful CT guided biopsy of left sided RP nodal mass.   EBL: Trace Complications: None immediate.   Katherina Right, MD Pager #: [phone]

## 2023-08-17 LAB — SURGICAL PATHOLOGY

## 2023-08-21 ENCOUNTER — Ambulatory Visit (HOSPITAL_COMMUNITY)
Admission: RE | Admit: 2023-08-21 | Discharge: 2023-08-21 | Disposition: A | Discharge status: Home / Self Care | Payer: Medicare Other | Source: Ambulatory Visit | Attending: Radiation Oncology | Admitting: Radiation Oncology

## 2023-08-21 DIAGNOSIS — C61 Malignant neoplasm of prostate: Secondary | ICD-10-CM | POA: Diagnosis not present

## 2023-08-21 DIAGNOSIS — R591 Generalized enlarged lymph nodes: Secondary | ICD-10-CM | POA: Diagnosis not present

## 2023-08-21 DIAGNOSIS — C770 Secondary and unspecified malignant neoplasm of lymph nodes of head, face and neck: Secondary | ICD-10-CM | POA: Diagnosis not present

## 2023-08-21 DIAGNOSIS — J432 Centrilobular emphysema: Secondary | ICD-10-CM | POA: Diagnosis not present

## 2023-08-21 MED ORDER — FLOTUFOLASTAT F 18 GALLIUM 296-5846 MBQ/ML IV SOLN
7.7700 | Freq: Once | INTRAVENOUS | Status: AC
  Administered 2023-08-21: 7.77 via INTRAVENOUS

## 2023-08-24 NOTE — Progress Notes (Signed)
RN placed request for PSMA PET to be reviewed prior to upcoming consult on 9/23.

## 2023-08-27 ENCOUNTER — Ambulatory Visit
Admission: RE | Admit: 2023-08-27 | Discharge: 2023-08-27 | Disposition: A | Discharge status: Home / Self Care | Payer: Medicare Other | Source: Ambulatory Visit | Attending: Radiology | Admitting: Radiology

## 2023-08-27 DIAGNOSIS — Z191 Hormone sensitive malignancy status: Secondary | ICD-10-CM | POA: Diagnosis not present

## 2023-08-27 DIAGNOSIS — C61 Malignant neoplasm of prostate: Secondary | ICD-10-CM | POA: Diagnosis not present

## 2023-08-27 NOTE — Progress Notes (Signed)
Radiation Oncology         [phone] ________________________________  Name: Ian Dudley MRN: [id number]  Date: 08/27/2023  DOB: [birth year]  Post Treatment Note  CC: Corine Shelter, MD  Corine Shelter, MD  Diagnosis:  82 y.o. gentleman with a PSA of 73.3; s/p RALP 01/2008 for Stage pT3aN0, Gleason 4+4 prostate cancer   Narrative:  The patient returns today to review most resent PET scan results.   Patient was last seen by [place] on 08/14/23 for an initial consult. At that time we had decided to proceed with a PET scan to rule out any additional metastases. PET from 08/21/23 unfortunately showed metastatic lymphadenopathy in the left lower neck, mediastinum, bilateral retrocrural regions, and abdominal retroperitoneum in the left para-aortic region.    HPI from 08/14/23: Ian Dudley is a 82 y.o. male with a diagnosis of recurrent prostate cancer. He was initially diagnosed with Gleason 3+4 prostate cancer on 10/15/07. His PSA in 12/2007 was up to 21. He opted to proceed with RALP on 01/22/08 under Dr. Merilynn Finland at [facility]. Pathology revealed Gleason 4+4 prostatic adenocarcinoma with extracapsular extension on right side, negative margins, extensive perineural invasion, and no seminal vesicle invasion. His postoperative PSA was undetectable. Unfortunately, his PSA quickly became detectable at 0.1 in 12/2008 and 0.2 in 07/2009. He was referred to Dr. Dayton Scrape and ultimately received salvage radiation therapy to the prostatectomy bed. He was also enrolled in the [employer] enzalutamide trial through 2021.    Since that time, he was switched to ADT with Eligard. Initially, his PSA responded well and dropped to 0.48. However, this rose again to 4.26 in 11/2021 and 12.2 in 03/2022. He was subsequently started on bicalutamide Diana Eves) in 03/2022. Per notes from Dr. Berneice Heinrich, he could not take Xtandi continuously due to cost and availability. This caused a fluctuation in his PSA levels. Overall, his PSA  has continued to rise, to 26 in 12/2022, 33 in 03/2023, and 73.3 in 07/2023. This prompted a restaging CT A/P on 07/23/23 showing: new pathologic retrocrural and para-aortic adenopathy, with enlarging lower thoracic para-aortic adenopathy; no definite pathologic adenopathy in pelvis. He is scheduled for biopsy of the peri-aortic lymph nodes on 08/15/2023 and PSMA PET on 08/21/2023.   ALLERGIES:  has No Known Allergies.  Meds: Current Outpatient Medications  Medication Sig Dispense Refill   acetaminophen (TYLENOL) 500 MG tablet Take 500 mg by mouth every 6 (six) hours as needed.     glipiZIDE (GLUCOTROL XL) 10 MG 24 hr tablet Take 10 mg by mouth 4 (four) times a week.     lisinopril-hydrochlorothiazide (PRINZIDE,ZESTORETIC) 10-12.5 MG tablet Take 1 tablet by mouth daily. THIS IS THE LAST AUTHORIZED REFILL FOR THIS MEDICATION.  Please have future refills authorized by PCP 30 tablet 0   metFORMIN (GLUCOPHAGE-XR) 500 MG 24 hr tablet Take 500 mg by mouth daily.     pioglitazone (ACTOS) 30 MG tablet Take 30 mg by mouth daily.     simvastatin (ZOCOR) 10 MG tablet Take 10 mg by mouth daily.     XTANDI 40 MG tablet Take 160 mg by mouth daily.     No current facility-administered medications for this encounter.    Physical Findings:  vitals were not taken for this visit.   /10 In general this is a well appearing male in no acute distress. He's alert and oriented x4 and appropriate throughout the examination. Cardiopulmonary assessment is negative for acute distress and he exhibits normal effort.   Lab Findings: Lab  Results  Component Value Date   WBC 4.1 08/15/2023   HGB 10.9 (L) 08/15/2023   HCT 34.3 (L) 08/15/2023   MCV 101.5 (H) 08/15/2023   PLT 240 08/15/2023     Radiographic Findings: NM PET (PSMA) SKULL TO MID THIGH  Result Date: 08/24/2023 CLINICAL DATA:  Prostate carcinoma with biochemical recurrence. EXAM: NUCLEAR MEDICINE PET SKULL BASE TO THIGH TECHNIQUE: 7.8 mCi Flotufolastat  (Posluma) was injected intravenously. Full-ring PET imaging was performed from the skull base to thigh after the radiotracer. CT data was obtained and used for attenuation correction and anatomic localization. COMPARISON:  CT on 07/23/2023 FINDINGS: NECK Left lower jugular and supraclavicular lymphadenopathy is seen which shows intense radiotracer uptake. Largest lymph node in the left supraclavicular region measures 1.6 cm short axis. SUV max measures 103.4. Incidental CT finding: None. CHEST Mediastinal lymphadenopathy showing intense radiotracer uptake is seen in the left prevascular and lateral aortic spaces, and in the posterior mediastinum between the mid esophagus and descending thoracic aorta. Largest lymph node in the left prevascular mediastinum measures 2.4 cm short axis, with SUV max of 130.9. No suspicious pulmonary nodules on the CT scan. Incidental CT finding: Mild centrilobular emphysema. ABDOMEN/PELVIS Prostate: Prior prostatectomy. No abnormal radiotracer uptake seen in the prostatectomy bed. Lymph nodes: Bilateral retrocrural lymphadenopathy shows intense radiotracer accumulation, with index node in the right retrocrural space measuring 13 mm short axis with SUV max of 130.6. Left para-aortic lymphadenopathy measures up to 2.9 cm short axis, with SUV max of 168.3. No positive lymph nodes are seen within the pelvis. Liver: No evidence of liver metastasis. Incidental CT finding: None. SKELETON No focal activity to suggest skeletal metastasis. IMPRESSION: Prior prostatectomy. No evidence of local tumor recurrence. Metastatic lymphadenopathy in the left lower neck, mediastinum, bilateral retrocrural regions, and abdominal retroperitoneum in left para-aortic region. No evidence of pelvic metastatic lymphadenopathy or skeletal metastases. Emphysema (ICD10-J43.9). Electronically Signed   By: Danae Orleans M.D.   On: 08/24/2023 14:56   CT ABDOMINAL MASS BIOPSY  Result Date: 08/15/2023 INDICATION:  History of prostate cancer, now with retroperitoneal lymphadenopathy worrisome for metastatic disease. Please perform CT-guided biopsy for tissue diagnostic purposes. EXAM: CT-GUIDED LEFT-SIDED RETROPERITONEAL NODAL MASS BIOPSY COMPARISON:  CT abdomen and pelvis-07/23/2023 MEDICATIONS: None. ANESTHESIA/SEDATION: Moderate (conscious) sedation was employed during this procedure as administered by the Interventional Radiology RN. A total of Versed 1 mg and Fentanyl 50 mcg was administered intravenously. Moderate Sedation Time: 15 minutes. The patient's level of consciousness and vital signs were monitored continuously by radiology nursing throughout the procedure under my direct supervision. CONTRAST:  None. COMPLICATIONS: SIR Level A - No therapy, no consequence. Small amount of asymptomatic Peri biopsy hemorrhage noted on completion imaging (series 6) PROCEDURE: Informed consent was obtained from the patient following an explanation of the procedure, risks, benefits and alternatives. A time out was performed prior to the initiation of the procedure. The patient was positioned prone on the CT table and a limited CT was performed for procedural planning demonstrating unchanged size and appearance of the 3.9 x 2.9 cm left-sided retroperitoneal nodal mass (image 44, series 2). The procedure was planned. The operative site was prepped and draped in the usual sterile fashion. Appropriate trajectory was confirmed with a 22 gauge spinal needle after the adjacent tissues were anesthetized with 1% Lidocaine with epinephrine. Under intermittent CT guidance, a 17 gauge coaxial needle was advanced into the peripheral aspect of the mass. Appropriate positioning was confirmed and 6 core needle biopsy samples were obtained with  an 18 gauge core needle biopsy device. The co-axial needle was removed following the administration of a Gel-Foam slurry and superficial hemostasis was achieved with manual compression. A limited  postprocedural CT demonstrates a small amount of asymptomatic peri biopsy hemorrhage/hematoma. A dressing was applied. The patient tolerated the procedure well without immediate postprocedural complication. IMPRESSION: Technically successful CT guided core needle biopsy of indeterminate left-sided retroperitoneal nodal mass. Electronically Signed   By: Simonne Come M.D.   On: 08/15/2023 11:27    Impression/Plan: 1. 82 y.o. gentleman with a PSA of 73.3; s/p RALP 01/2008 for Stage pT3aN0, Gleason 4+4 prostate cancer   We reviewed the patient's imaging with him today. Unfortunately PET shows further metastases within the lymph nodes. Radiation treatment is not a suitable option at this point due to the extent of the disease. Patient may benefit from systemic therapy at this point. We will share our discussion with Dr. Berneice Heinrich and leave the decision to him for referral for systemic care, if deemed appropriate. The patient expressed understanding and agreement of the stated plan. We appreciate the opportunity to participate in this patient's care and are happy to reassess PRN.   I personally spent 30 minutes in this encounter including chart review, reviewing radiological studies, meeting face-to-face with the patient, entering orders and completing documentation.    Joyice Faster, PA-C    Margaretmary Dys, MD  [facility]  Radiation Oncology Direct Dial: [phone]  Fax: [phone] [email or web address]  Skype  [email or web address]

## 2023-08-30 NOTE — Progress Notes (Signed)
Patient will referred back to Dr. Berneice Heinrich to review for systemic care, if deemed appropriate.  Patient is scheduled at [facility] for med dispense on 10/1.

## 2023-09-04 DIAGNOSIS — Z23 Encounter for immunization: Secondary | ICD-10-CM | POA: Diagnosis not present

## 2023-09-04 DIAGNOSIS — Z0001 Encounter for general adult medical examination with abnormal findings: Secondary | ICD-10-CM | POA: Diagnosis not present

## 2023-09-04 DIAGNOSIS — M1990 Unspecified osteoarthritis, unspecified site: Secondary | ICD-10-CM | POA: Diagnosis not present

## 2023-09-04 DIAGNOSIS — I119 Hypertensive heart disease without heart failure: Secondary | ICD-10-CM | POA: Diagnosis not present

## 2023-09-04 DIAGNOSIS — Z1331 Encounter for screening for depression: Secondary | ICD-10-CM | POA: Diagnosis not present

## 2023-09-04 DIAGNOSIS — E119 Type 2 diabetes mellitus without complications: Secondary | ICD-10-CM | POA: Diagnosis not present

## 2023-10-05 DIAGNOSIS — E119 Type 2 diabetes mellitus without complications: Secondary | ICD-10-CM | POA: Diagnosis not present

## 2023-10-22 DIAGNOSIS — C61 Malignant neoplasm of prostate: Secondary | ICD-10-CM | POA: Diagnosis not present

## 2023-11-04 DIAGNOSIS — E119 Type 2 diabetes mellitus without complications: Secondary | ICD-10-CM | POA: Diagnosis not present

## 2023-11-08 DIAGNOSIS — C61 Malignant neoplasm of prostate: Secondary | ICD-10-CM | POA: Diagnosis not present

## 2023-11-15 DIAGNOSIS — C778 Secondary and unspecified malignant neoplasm of lymph nodes of multiple regions: Secondary | ICD-10-CM | POA: Diagnosis not present

## 2023-11-15 DIAGNOSIS — C61 Malignant neoplasm of prostate: Secondary | ICD-10-CM | POA: Diagnosis not present

## 2023-11-15 DIAGNOSIS — N393 Stress incontinence (female) (male): Secondary | ICD-10-CM | POA: Diagnosis not present

## 2023-11-20 DIAGNOSIS — R109 Unspecified abdominal pain: Secondary | ICD-10-CM | POA: Diagnosis not present

## 2023-11-20 DIAGNOSIS — I119 Hypertensive heart disease without heart failure: Secondary | ICD-10-CM | POA: Diagnosis not present

## 2023-11-20 DIAGNOSIS — E119 Type 2 diabetes mellitus without complications: Secondary | ICD-10-CM | POA: Diagnosis not present

## 2023-11-20 DIAGNOSIS — M1990 Unspecified osteoarthritis, unspecified site: Secondary | ICD-10-CM | POA: Diagnosis not present

## 2023-12-05 DIAGNOSIS — E119 Type 2 diabetes mellitus without complications: Secondary | ICD-10-CM | POA: Diagnosis not present

## 2023-12-07 ENCOUNTER — Other Ambulatory Visit: Payer: Self-pay | Admitting: Genetic Counselor

## 2023-12-07 ENCOUNTER — Telehealth: Payer: Self-pay | Admitting: Genetic Counselor

## 2023-12-07 DIAGNOSIS — C61 Malignant neoplasm of prostate: Secondary | ICD-10-CM

## 2023-12-07 NOTE — Telephone Encounter (Signed)
 POS testing for prostate cancer requested.  I submitted orders, ordered [facility] and placed genetic testing order through the portal.  Blood should be drawn on 12/18/2023 and sent to [facility] for testing.

## 2023-12-17 DIAGNOSIS — C61 Malignant neoplasm of prostate: Secondary | ICD-10-CM | POA: Insufficient documentation

## 2023-12-17 DIAGNOSIS — Z9189 Other specified personal risk factors, not elsewhere classified: Secondary | ICD-10-CM | POA: Insufficient documentation

## 2023-12-17 NOTE — Assessment & Plan Note (Addendum)
  Supportive baseline bone mineral density study (previous fracture) calcium (1000-1200 mg daily from food and supplements) and vitamin D3 (1000 IU daily) Control and prevent diabetes Aggressive cardiovascular risk management Weight-bearing exercises (30 minutes per day) Limit alcohol consumption and avoid smoking

## 2023-12-17 NOTE — Progress Notes (Addendum)
 [facility] CONSULT NOTE  Patient Care Team: Hillman Bare, MD as PCP - General (Pulmonary Disease) Claudene Victory ORN, MD (Inactive) as PCP - Cardiology (Cardiology) Vertell Pont, RN as Oncology Nurse Navigator  Addendum Negative genetic testing.  A VUS in RAD51D was identified   ASSESSMENT & PLAN:  Ian Dudley is a 83 y.o.male with history of HTN, HLD,  being seen at [facility] for recurrent prostate cancer. 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 He had rising PSA and enrolled into [facility] in 2021.  PSMA PET in September 2024 showed lymphadenopathy above and below the diaphragm.  Per records from medical history, patient was on Xtandi from April 2023 to current.  He has been on ADT since 2022.  Most recent PSA in December 2024 was 99.  Testosterone  was 17.  Current diagnosis: mCRPC Initial diagnosis: 10/15/2007.  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 Extensive perineural invasion. PSA in 12/2007 was up to 21.  Progression: PSA 0.1 in 12/2008 and 0.2 in 07/2009.  Germline testing: to be drawn today Somatic testing: to be drawn today, both tissue and blood Previous Treatment: 01/22/2008 ([facility], Dr. Casimir) prostatectomy 2010 salvage radiation 2021 enrolled into [facility] enzalutamide trial    The patient was counseled on the natural history of prostate cancer and the standard treatment options that are available for prostate cancer.  We discussed he has progressed on ADT/ARPI to mCRPC. The next line of treatment will be chemotherapy with significant disease burden of lymphadenopathy.  Given his older age, consider lower-dose docetaxel  every 2 weeks.  Pluvicto  can be an option given PSMA avid.  We discussed treatment  is palliative, not curative.  We discussed without treatment life expectancy expected to be shorter for most people.  He has good performance status.  We discussed docetaxel  chemotherapy.  Potential side effect may include but not limited to fatigue, neutropenia, febrile neutropenia, thrombocytopenia, anemia, alopecia, skin rash, loss of nail and potentially infection, hypersensitivity, fluid retention with edema, diarrhea, nausea, vomiting, mouth sores, liver enzyme elevation, neuropathy, muscle pain, joint pain, visual change and rarely severe allergic reaction, skin rash, severe infection resulting in sepsis, and potential death.  Discussed the importance of recognizing fever, signs of infection while on treatment. Proceed to ED for emergency evaluation in the setting of fever, infection. Severe infection resulting in sepsis can be life threatening and result in death.  After discussion, he would like to proceed.  Once he starts docetaxel , we will need close monitoring for toxicity including labs.  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  Baseline PSMA PET Plan to start treatment in about 3-4 weeks Expect call from radiology for mediport placement If HRRm identified we will change treatment to PARPi  At risk for side effect of medication  Supportive baseline bone mineral density study (previous fracture) calcium (1000-1200 mg daily from food and supplements) and vitamin D3 (1000 IU daily) Control and prevent diabetes Aggressive cardiovascular risk management Weight-bearing exercises (30 minutes per day) Limit alcohol consumption and avoid smoking   Orders Placed This Encounter  Procedures   Consent Attestation for Oncology Treatment    The patient is informed of risks, benefits, side-effects of the  prescribed oncology treatment. Potential short term  and long term side effects and response rates discussed. After a long discussion, the patient made informed decision to proceed.:   Yes   DG Bone Density    Standing Status:   Future    Expected Date:   12/26/2023    Expiration Date:   12/16/2024    Reason for Exam (SYMPTOM  OR DIAGNOSIS REQUIRED):   Concerning for osteoporosis, previous fracture on ADT    Preferred imaging location?:   [address]   NM PET (PSMA) SKULL TO MID THIGH    Standing Status:   Future    Expected Date:   12/24/2023    Expiration Date:   12/16/2024    If indicated for the ordered procedure, I authorize the administration of a radiopharmaceutical per Radiology protocol:   Yes    Preferred imaging location?:   [facility]   IR IMAGING GUIDED PORT INSERTION    Standing Status:   Future    Expected Date:   01/01/2024    Expiration Date:   12/17/2024    Reason for Exam (SYMPTOM  OR DIAGNOSIS REQUIRED):   need port for chemo    Preferred Imaging Location?:   [facility]   CBC with Differential (Cancer Center Only)    Standing Status:   Future    Expiration Date:   12/16/2024   CMP (Cancer Center only)    Standing Status:   Future    Expiration Date:   12/16/2024   Lactate dehydrogenase    Standing Status:   Future    Expiration Date:   12/16/2024   PSA, total and free    Standing Status:   Future    Expiration Date:   12/16/2024   Testosterone     Standing Status:   Future    Expiration Date:   12/16/2024   CBC with Differential (Cancer Center Only)    Standing Status:   Future    Expected Date:   01/14/2024    Expiration Date:   01/13/2025   Prostate-Specific AG, Serum    Standing Status:   Future    Expected Date:   01/14/2024    Expiration Date:   01/13/2025   Testosterone     Standing Status:   Future    Expected Date:   01/14/2024    Expiration Date:   01/13/2025   CMP (Cancer Center only)    Standing Status:   Future    Expected Date:   01/14/2024    Expiration Date:   01/13/2025   All questions  were answered. The patient knows to call the clinic with any problems, questions or concerns. No barriers to learning was detected.  Ian JAYSON Chihuahua, MD 1/14/202511:53 AM  CHIEF COMPLAINTS/PURPOSE OF CONSULTATION:  Prostate cancer  HISTORY OF PRESENTING ILLNESS:  Ian Dudley 83 y.o. male is here because of prostate cancer. I have reviewed his chart and materials related to his cancer extensively and collaborated history with the patient.  No family history of prostate cancer, but a sister had colon cancer and another with breast cancer. Another brother with colon cancer.  He lives with his wife and working at [employer].  No paresthesia from diabetes.  Last Eligard  was in September.  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 Casimir) prostatectomy  Gleason 4+4 prostatic adenocarcinoma  with extracapsular extension on right side, negative margins and negative seminal vesicle invasion. Extensive perineural invasion.   12/2008 Tumor Marker   PSA 0.1   07/2009 Tumor Marker   PSA 0.2   11/2021 Tumor Marker   PSA 4.26   03/2022 Tumor Marker   PSA 12.2   03/2022 -  Chemotherapy   Started Geni Report he could not take continuously due to cost.   12/2022 Tumor Marker   PSA 26   03/2023 Tumor Marker   PSA 33   07/2023 Tumor Marker   PSA 73.3   07/23/2023 Imaging   CT AP new pathologic retrocrural and para-aortic adenopathy, with enlarging lower thoracic para-aortic adenopathy; no definite pathologic adenopathy in pelvis.      08/15/2023 Procedure   A. LYMPH NODE, LEFT SIDED RETROPERITONEAL NODAL MASS, NEEDLE CORE BIOPSY:      Metastatic carcinoma, consistent with prostate primary.       See comment.   COMMENT:   Immunohistochemical stains for NKX3.1 and prostein were performed and  are positive in the tumor cells. The  findings are supportive of  metastatic carcinoma with prostate primary.   Controls worked appropriately.    08/21/2023 PET scan   PSMA PET IMPRESSION: Prior prostatectomy. No evidence of local tumor recurrence.   Metastatic lymphadenopathy in the left lower neck, mediastinum, bilateral retrocrural regions, and abdominal retroperitoneum in left para-aortic region.   No evidence of pelvic metastatic lymphadenopathy or skeletal metastases.   Emphysema (ICD10-J43.9).   08/2023 Miscellaneous   Consultation with radiation oncology.  PSMA PET performed.  Radiation treatment is not a suitable option due to the extent of the disease.    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 (HCC)   01/14/2024 -  Chemotherapy   Patient is on Treatment Plan : PROSTATE Docetaxel  (75) + Prednisone  q21d       MEDICAL HISTORY:  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    Insect bite    Localized swelling, mass and lump, unspecified lower limb    Myalgia    Osteoarthritis    Other forms of dyspnea    Other long term (current) drug therapy    Pain in right knee    Personal history of malignant neoplasm of prostate    Prostate cancer (HCC)    Pure hypercholesterolemia    Secondary cataract     SURGICAL HISTORY: Past Surgical History:  Procedure Laterality Date   HUMERUS IM NAIL Left 11/15/2022   Procedure: INTRAMEDULLARY (IM) NAIL HUMERAL;  Surgeon: Cristy Bonner DASEN, MD;  Location: [facility];  Service: Orthopedics;  Laterality: Left;   PROSTATE BIOPSY     PROSTATECTOMY      TONSILLECTOMY      SOCIAL HISTORY: Social History   Socioeconomic History   Marital status: Married    Spouse name: Not on file   Number of children: Not on file   Years of education: Not on file   Highest education level: Not  on file  Occupational History   Not on file  Tobacco Use   Smoking status: Former   Smokeless tobacco: Never  Vaping Use   Vaping status: Never Used  Substance and Sexual Activity   Alcohol use: No    Alcohol/week: 0.0 standard drinks of alcohol   Drug use: No   Sexual activity: Not on file  Other Topics Concern   Not on file  Social History Narrative   Not on file   Social Drivers of Health   Financial Resource Strain: Not on file  Food Insecurity: No Food Insecurity (12/18/2023)   Hunger Vital Sign    Worried About Running Out of Food in the Last Year: Never true    Ran Out of Food in the Last Year: Never true  Transportation Needs: No Transportation Needs (12/18/2023)   PRAPARE - Administrator, Civil Service (Medical): No    Lack of Transportation (Non-Medical): No  Physical Activity: Not on file  Stress: Not on file  Social Connections: Unknown (04/18/2022)   Received from [facility]   Social Network    Social Network: Not on file  Intimate Partner Violence: Not At Risk (12/18/2023)   Humiliation, Afraid, Rape, and Kick questionnaire    Fear of Current or Ex-Partner: No    Emotionally Abused: No    Physically Abused: No    Sexually Abused: No    FAMILY HISTORY: Family History  Problem Relation Age of Onset   Hypertension Mother    Diabetes Mother    Kidney failure Mother    Breast cancer Sister    Lupus Sister    Colon cancer Brother    Prostate cancer Brother    Colon cancer Sister    Healthy Sister    Healthy Brother    Healthy Brother     ALLERGIES:  has no known allergies.  MEDICATIONS:  Current Outpatient Medications  Medication Sig Dispense Refill   acetaminophen  (TYLENOL ) 500 MG tablet Take 500  mg by mouth every 6 (six) hours as needed.     ascorbic acid (VITAMIN C) 500 MG tablet Take 500 mg by mouth once a week.     glipiZIDE (GLUCOTROL XL) 10 MG 24 hr tablet Take 10 mg by mouth 4 (four) times a week.     lisinopril -hydrochlorothiazide  (PRINZIDE ,ZESTORETIC ) 10-12.5 MG tablet Take 1 tablet by mouth daily. THIS IS THE LAST AUTHORIZED REFILL FOR THIS MEDICATION.  Please have future refills authorized by PCP 30 tablet 0   metFORMIN (GLUCOPHAGE-XR) 500 MG 24 hr tablet Take 500 mg by mouth daily.     pioglitazone (ACTOS) 30 MG tablet Take 30 mg by mouth daily.     simvastatin (ZOCOR) 10 MG tablet Take 10 mg by mouth daily.     XTANDI 40 MG tablet Take 160 mg by mouth daily.     dexamethasone  (DECADRON ) 4 MG tablet Take 2 tabs by mouth 2 times daily starting day before chemo. Then take 2 tabs daily for 2 days starting day after chemo. Take with food. 30 tablet 1   lidocaine -prilocaine  (EMLA ) cream Apply to affected area once 30 g 3   ondansetron  (ZOFRAN ) 8 MG tablet Take 1 tablet (8 mg total) by mouth every 8 (eight) hours as needed for nausea or vomiting. 30 tablet 1   predniSONE  (DELTASONE ) 5 MG tablet Take 1 tablet (5 mg total) by mouth in the morning and at bedtime. 60 tablet 11   prochlorperazine  (COMPAZINE ) 10 MG tablet Take 1  tablet (10 mg total) by mouth every 6 (six) hours as needed for nausea or vomiting. 30 tablet 1   No current facility-administered medications for this visit.    REVIEW OF SYSTEMS:   All relevant systems were reviewed with the patient and are negative.  PHYSICAL EXAMINATION: ECOG PERFORMANCE STATUS: 1 - Symptomatic but completely ambulatory  Vitals:   12/18/23 1040  BP: (!) 148/47  Pulse: 70  Resp: 18  Temp: 97.8 F (36.6 C)   Filed Weights   12/18/23 1040  Weight: 209 lb 3.2 oz (94.9 kg)    GENERAL: alert, no distress and comfortable SKIN: skin color is normal, no jaundice EYES: sclera clear OROPHARYNX: no exudate, no erythema NECK:  supple LYMPH:  no palpable lymphadenopathy in the cervical regions LUNGS: Effort normal, no respiratory distress.  Clear to auscultation bilaterally HEART: regular rate & rhythm and no lower extremity edema ABDOMEN: soft, non-tender and nondistended Musculoskeletal: no point tenderness  LABORATORY DATA:  I have reviewed the data as listed Lab Results  Component Value Date   WBC 4.1 08/15/2023   HGB 10.9 (L) 08/15/2023   HCT 34.3 (L) 08/15/2023   MCV 101.5 (H) 08/15/2023   PLT 240 08/15/2023   No results for input(s): NA, K, CL, CO2, GLUCOSE, BUN, CREATININE, CALCIUM, GFRNONAA, GFRAA, PROT, ALBUMIN, AST, ALT, ALKPHOS, BILITOT, BILIDIR, IBILI in the last 8760 hours.  RADIOGRAPHIC STUDIES: I have personally reviewed the radiological images as listed and agreed with the findings in the report. No results found.

## 2023-12-17 NOTE — Assessment & Plan Note (Addendum)
 Genetic testing and somatic testing today Docetaxel  50 mg/m every 2 weeks New baseline PSA, testosterone , CBC and CMP.  Baseline PSMA PET Plan to start treatment in about 3-4 weeks Expect call from radiology for mediport placement If HRRm identified we will change treatment to PARPi

## 2023-12-18 ENCOUNTER — Inpatient Hospital Stay: Payer: Medicare Other

## 2023-12-18 ENCOUNTER — Other Ambulatory Visit: Payer: Self-pay

## 2023-12-18 ENCOUNTER — Other Ambulatory Visit: Payer: Self-pay | Admitting: Genetic Counselor

## 2023-12-18 VITALS — BP 148/47 | HR 70 | Temp 97.8°F | Resp 18 | Wt 209.2 lb

## 2023-12-18 DIAGNOSIS — Z8249 Family history of ischemic heart disease and other diseases of the circulatory system: Secondary | ICD-10-CM | POA: Insufficient documentation

## 2023-12-18 DIAGNOSIS — Z9079 Acquired absence of other genital organ(s): Secondary | ICD-10-CM | POA: Diagnosis not present

## 2023-12-18 DIAGNOSIS — C779 Secondary and unspecified malignant neoplasm of lymph node, unspecified: Secondary | ICD-10-CM

## 2023-12-18 DIAGNOSIS — Z8379 Family history of other diseases of the digestive system: Secondary | ICD-10-CM | POA: Insufficient documentation

## 2023-12-18 DIAGNOSIS — Z841 Family history of disorders of kidney and ureter: Secondary | ICD-10-CM | POA: Diagnosis not present

## 2023-12-18 DIAGNOSIS — C61 Malignant neoplasm of prostate: Secondary | ICD-10-CM

## 2023-12-18 DIAGNOSIS — Z9089 Acquired absence of other organs: Secondary | ICD-10-CM | POA: Diagnosis not present

## 2023-12-18 DIAGNOSIS — N182 Chronic kidney disease, stage 2 (mild): Secondary | ICD-10-CM | POA: Diagnosis not present

## 2023-12-18 DIAGNOSIS — Z833 Family history of diabetes mellitus: Secondary | ICD-10-CM | POA: Diagnosis not present

## 2023-12-18 DIAGNOSIS — I130 Hypertensive heart and chronic kidney disease with heart failure and stage 1 through stage 4 chronic kidney disease, or unspecified chronic kidney disease: Secondary | ICD-10-CM | POA: Diagnosis not present

## 2023-12-18 DIAGNOSIS — Z923 Personal history of irradiation: Secondary | ICD-10-CM | POA: Diagnosis not present

## 2023-12-18 DIAGNOSIS — E1122 Type 2 diabetes mellitus with diabetic chronic kidney disease: Secondary | ICD-10-CM | POA: Insufficient documentation

## 2023-12-18 DIAGNOSIS — Z803 Family history of malignant neoplasm of breast: Secondary | ICD-10-CM | POA: Insufficient documentation

## 2023-12-18 DIAGNOSIS — Z79899 Other long term (current) drug therapy: Secondary | ICD-10-CM | POA: Diagnosis not present

## 2023-12-18 DIAGNOSIS — I509 Heart failure, unspecified: Secondary | ICD-10-CM | POA: Insufficient documentation

## 2023-12-18 DIAGNOSIS — Z8042 Family history of malignant neoplasm of prostate: Secondary | ICD-10-CM | POA: Diagnosis not present

## 2023-12-18 DIAGNOSIS — Z800 Family history of malignant neoplasm of digestive organs: Secondary | ICD-10-CM | POA: Diagnosis not present

## 2023-12-18 DIAGNOSIS — Z9189 Other specified personal risk factors, not elsewhere classified: Secondary | ICD-10-CM

## 2023-12-18 LAB — CBC WITH DIFFERENTIAL (CANCER CENTER ONLY)
Abs Immature Granulocytes: 0.01 10*3/uL (ref 0.00–0.07)
Basophils Absolute: 0 10*3/uL (ref 0.0–0.1)
Basophils Relative: 0 %
Eosinophils Absolute: 0 10*3/uL (ref 0.0–0.5)
Eosinophils Relative: 1 %
HCT: 33.9 % — ABNORMAL LOW (ref 39.0–52.0)
Hemoglobin: 11.1 g/dL — ABNORMAL LOW (ref 13.0–17.0)
Immature Granulocytes: 0 %
Lymphocytes Relative: 23 %
Lymphs Abs: 0.8 10*3/uL (ref 0.7–4.0)
MCH: 32.7 pg (ref 26.0–34.0)
MCHC: 32.7 g/dL (ref 30.0–36.0)
MCV: 100 fL (ref 80.0–100.0)
Monocytes Absolute: 0.2 10*3/uL (ref 0.1–1.0)
Monocytes Relative: 8 %
Neutro Abs: 2.2 10*3/uL (ref 1.7–7.7)
Neutrophils Relative %: 68 %
Platelet Count: 226 10*3/uL (ref 150–400)
RBC: 3.39 MIL/uL — ABNORMAL LOW (ref 4.22–5.81)
RDW: 15.1 % (ref 11.5–15.5)
WBC Count: 3.2 10*3/uL — ABNORMAL LOW (ref 4.0–10.5)
nRBC: 0 % (ref 0.0–0.2)

## 2023-12-18 LAB — CMP (CANCER CENTER ONLY)
ALT: 7 U/L (ref 0–44)
AST: 17 U/L (ref 15–41)
Albumin: 3.7 g/dL (ref 3.5–5.0)
Alkaline Phosphatase: 59 U/L (ref 38–126)
Anion gap: 3 — ABNORMAL LOW (ref 5–15)
BUN: 35 mg/dL — ABNORMAL HIGH (ref 8–23)
CO2: 29 mmol/L (ref 22–32)
Calcium: 9.4 mg/dL (ref 8.9–10.3)
Chloride: 108 mmol/L (ref 98–111)
Creatinine: 1.42 mg/dL — ABNORMAL HIGH (ref 0.61–1.24)
GFR, Estimated: 49 mL/min — ABNORMAL LOW (ref >60)
Glucose, Bld: 97 mg/dL (ref 70–99)
Potassium: 4.8 mmol/L (ref 3.5–5.1)
Sodium: 140 mmol/L (ref 135–145)
Total Bilirubin: 0.4 mg/dL (ref 0.0–1.2)
Total Protein: 6.6 g/dL (ref 6.5–8.1)

## 2023-12-18 LAB — LACTATE DEHYDROGENASE: LDH: 218 U/L — ABNORMAL HIGH (ref 98–192)

## 2023-12-18 LAB — MISCELLANEOUS TEST

## 2023-12-18 LAB — GENETIC SCREENING ORDER

## 2023-12-18 MED ORDER — PROCHLORPERAZINE MALEATE 10 MG PO TABS: 10.0000 mg | ORAL_TABLET | Freq: Four times a day (QID) | ORAL | 1 refills | Status: DC | PRN

## 2023-12-18 MED ORDER — LIDOCAINE-PRILOCAINE 2.5-2.5 % EX CREA: TOPICAL_CREAM | CUTANEOUS | 3 refills | Status: DC

## 2023-12-18 MED ORDER — DEXAMETHASONE 4 MG PO TABS: ORAL_TABLET | ORAL | 1 refills | Status: DC

## 2023-12-18 MED ORDER — PREDNISONE 5 MG PO TABS: 5.0000 mg | ORAL_TABLET | Freq: Two times a day (BID) | ORAL | 11 refills | Status: DC

## 2023-12-18 MED ORDER — ONDANSETRON HCL 8 MG PO TABS: 8.0000 mg | ORAL_TABLET | Freq: Three times a day (TID) | ORAL | 1 refills | Status: DC | PRN

## 2023-12-18 NOTE — Patient Instructions (Signed)
 We discussed docetaxel  today.  We discussed docetaxel  chemotherapy.  Potential side effect may include but not limited to fatigue, neutropenia, febrile neutropenia, thrombocytopenia, anemia, alopecia, skin rash, loss of nail and potentially infection, hypersensitivity, fluid retention with edema, diarrhea, nausea, vomiting, mouth sores, liver enzyme elevation, neuropathy, muscle pain, joint pain, visual change and rarely severe allergic reaction, skin rash, severe infection resulting in sepsis, and potential death.  Discussed the importance of recognizing fever, signs of infection while on treatment. Proceed to ED for emergency evaluation in the setting of fever, infection. Severe infection resulting in sepsis can be life threatening and result in death if no treated early. 4% of deaths were reported in both group.   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  Baseline PSMA PET Plan to start treatment in about 3-4 weeks Expect call from radiology for mediport placement  Supportive baseline bone mineral density study (previous fracture) calcium (1000-1200 mg daily from food and supplements) and vitamin D3 (1000 IU daily) Control and prevent diabetes Aggressive cardiovascular risk management Weight-bearing exercises (30 minutes per day) Limit alcohol consumption and avoid smoking

## 2023-12-18 NOTE — Progress Notes (Signed)
 Re-Introduced myself to the patient as the prostate nurse navigator.  He is here to discuss his systemic treatment options with Dr. Tina.  Patient agreeable for [facility], [facility], port placement, Docetaxel , and PSMA PET.  RN will follow to ensure plan of care is in progress.  I gave him my business card and asked him to call me with questions or concerns.  Verbalized understanding.   [facility] order successfully faxed to [phone] with patient signature.

## 2023-12-18 NOTE — Progress Notes (Signed)
START ON PATHWAY REGIMEN - Prostate ? ? ?  A cycle is every 21 days: ?    Prednisone  ?    Docetaxel  ? ?**Always confirm dose/schedule in your pharmacy ordering system** ? ?Patient Characteristics: ?Adenocarcinoma, Recurrent/New Systemic Disease (Including Biochemical Recurrence), Castration Resistant, M1, Prior Novel Hormonal Agent, No Molecular Alteration or Targeted Therapy Exhausted, No Prior Docetaxel ?Histology: Adenocarcinoma ?Therapeutic Status: Recurrent/New Systemic Disease (Including Biochemical Recurrence) ? ?Intent of Therapy: ?Non-Curative / Palliative Intent, Discussed with Patient ?

## 2023-12-19 ENCOUNTER — Other Ambulatory Visit: Payer: Self-pay

## 2023-12-19 ENCOUNTER — Telehealth: Payer: Self-pay

## 2023-12-19 LAB — TESTOSTERONE: Testosterone: 16 ng/dL — ABNORMAL LOW (ref 264–916)

## 2023-12-19 LAB — PSA, TOTAL AND FREE
PSA, Free Pct: 32.8 %
PSA, Free: 37.7 ng/mL
Prostate Specific Ag, Serum: 115 ng/mL — ABNORMAL HIGH (ref 0.0–4.0)

## 2023-12-20 ENCOUNTER — Other Ambulatory Visit: Payer: Self-pay

## 2023-12-23 DIAGNOSIS — C61 Malignant neoplasm of prostate: Secondary | ICD-10-CM | POA: Diagnosis not present

## 2023-12-23 DIAGNOSIS — C799 Secondary malignant neoplasm of unspecified site: Secondary | ICD-10-CM | POA: Diagnosis not present

## 2023-12-24 NOTE — Progress Notes (Signed)
RN spoke with patient reviewed recommendations and appointments.  Patient aware and does not have additional questions or barriers at this time.  He will receive his first cycle of Docetaxel on 2/10.  Plan of care in progress.

## 2023-12-25 ENCOUNTER — Encounter (HOSPITAL_COMMUNITY)
Admission: RE | Admit: 2023-12-25 | Discharge: 2023-12-25 | Disposition: A | Discharge status: Home / Self Care | Payer: Medicare Other | Source: Ambulatory Visit

## 2023-12-25 DIAGNOSIS — C61 Malignant neoplasm of prostate: Secondary | ICD-10-CM | POA: Insufficient documentation

## 2023-12-25 DIAGNOSIS — C7951 Secondary malignant neoplasm of bone: Secondary | ICD-10-CM | POA: Diagnosis not present

## 2023-12-25 DIAGNOSIS — C799 Secondary malignant neoplasm of unspecified site: Secondary | ICD-10-CM | POA: Diagnosis not present

## 2023-12-25 MED ORDER — FLOTUFOLASTAT F 18 GALLIUM 296-5846 MBQ/ML IV SOLN
8.1000 | Freq: Once | INTRAVENOUS | Status: AC
  Administered 2023-12-25: 8.1 via INTRAVENOUS

## 2023-12-26 DIAGNOSIS — C799 Secondary malignant neoplasm of unspecified site: Secondary | ICD-10-CM | POA: Diagnosis not present

## 2023-12-26 DIAGNOSIS — C61 Malignant neoplasm of prostate: Secondary | ICD-10-CM | POA: Diagnosis not present

## 2023-12-28 DIAGNOSIS — C61 Malignant neoplasm of prostate: Secondary | ICD-10-CM | POA: Diagnosis not present

## 2023-12-28 DIAGNOSIS — C799 Secondary malignant neoplasm of unspecified site: Secondary | ICD-10-CM | POA: Diagnosis not present

## 2023-12-29 DIAGNOSIS — C799 Secondary malignant neoplasm of unspecified site: Secondary | ICD-10-CM | POA: Diagnosis not present

## 2023-12-29 DIAGNOSIS — C61 Malignant neoplasm of prostate: Secondary | ICD-10-CM | POA: Diagnosis not present

## 2023-12-31 ENCOUNTER — Other Ambulatory Visit: Payer: Self-pay | Admitting: Radiology

## 2023-12-31 DIAGNOSIS — C799 Secondary malignant neoplasm of unspecified site: Secondary | ICD-10-CM | POA: Diagnosis not present

## 2023-12-31 DIAGNOSIS — C61 Malignant neoplasm of prostate: Secondary | ICD-10-CM | POA: Diagnosis not present

## 2023-12-31 NOTE — H&P (Signed)
Chief Complaint: Recurrent prostate cancer; referred for port a cath placement to assist with treatment  Referring Provider(s): Chang,R  Supervising Physician: Gilmer Mor  Patient Status: [facility] - Out-pt  History of Present Illness: Ian Dudley is an 83 y.o. male ex smoker with PMH sig for CHF, CKD, DM, first degree AV block, GERD, HTN, OA, HLD and prostate cancer initially diagnosed in 2018 with prior prostatectomy who presents now with recurrent disease. He underwent left RP LN bx by IR team on 08/15/23. He is scheduled today for port a cath placement to assist with treatment.   *** Patient is Full Code  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    Insect bite    Localized swelling, mass and lump, unspecified lower limb    Myalgia    Osteoarthritis    Other forms of dyspnea    Other long term (current) drug therapy    Pain in right knee    Personal history of malignant neoplasm of prostate    Prostate cancer (HCC)    Pure hypercholesterolemia    Secondary cataract     Past Surgical History:  Procedure Laterality Date   HUMERUS IM NAIL Left 11/15/2022   Procedure: INTRAMEDULLARY (IM) NAIL HUMERAL;  Surgeon: Bjorn Pippin, MD;  Location: [facility];  Service: Orthopedics;  Laterality: Left;   PROSTATE BIOPSY     PROSTATECTOMY     TONSILLECTOMY      Allergies: Patient has no known allergies.  Medications: Prior to Admission medications   Medication Sig Start Date End Date Taking? Authorizing Provider  acetaminophen (TYLENOL) 500 MG tablet Take 500 mg by mouth every 6 (six) hours as needed.    [provider]  ascorbic acid (VITAMIN C) 500 MG tablet Take 500 mg by mouth once a week.    [provider]  dexamethasone (DECADRON) 4 MG tablet Take 2 tabs by mouth 2 times daily starting day before chemo. Then take 2 tabs daily for 2 days starting day after chemo. Take with food. 12/18/23   Melven Sartorius, MD  glipiZIDE (GLUCOTROL XL) 10 MG 24 hr tablet Take 10 mg by mouth 4 (four) times a week. 10/14/22   [provider]  lidocaine-prilocaine (EMLA) cream Apply to affected area once 12/18/23   Melven Sartorius, MD  lisinopril-hydrochlorothiazide (PRINZIDE,ZESTORETIC) 10-12.5 MG tablet Take 1 tablet by mouth daily. THIS IS THE LAST AUTHORIZED REFILL FOR THIS MEDICATION.  Please have future refills authorized by PCP 03/07/17   Lyn Records, MD  metFORMIN (GLUCOPHAGE-XR) 500 MG 24 hr tablet Take 500 mg by mouth daily. 05/18/23   [provider]  ondansetron (ZOFRAN) 8 MG tablet Take 1 tablet (8 mg total) by mouth every 8 (eight) hours as needed for nausea or vomiting. 12/18/23   Melven Sartorius, MD  pioglitazone (ACTOS) 30 MG tablet Take 30 mg by mouth daily.    [provider]  predniSONE (DELTASONE) 5 MG tablet Take 1 tablet (5 mg total) by mouth in the morning and  at bedtime. 12/18/23   Melven Sartorius, MD  prochlorperazine (COMPAZINE) 10 MG tablet Take 1 tablet (10 mg total) by mouth every 6 (six) hours as needed for nausea or vomiting. 12/18/23   Melven Sartorius, MD  simvastatin (ZOCOR) 10 MG tablet Take 10 mg by mouth daily.    [provider]  XTANDI 40 MG tablet Take 160 mg by mouth daily. 11/07/22   [provider]     Family History  Problem Relation Age of Onset   Hypertension Mother    Diabetes Mother    Kidney failure Mother    Breast cancer Sister    Lupus Sister    Colon cancer Brother    Prostate cancer Brother    Colon cancer Sister    Healthy Sister    Healthy Brother    Healthy Brother     Social History    Socioeconomic History   Marital status: Married    Spouse name: Not on file   Number of children: Not on file   Years of education: Not on file   Highest education level: Not on file  Occupational History   Not on file  Tobacco Use   Smoking status: Former   Smokeless tobacco: Never  Vaping Use   Vaping status: Never Used  Substance and Sexual Activity   Alcohol use: No    Alcohol/week: 0.0 standard drinks of alcohol   Drug use: No   Sexual activity: Not on file  Other Topics Concern   Not on file  Social History Narrative   Not on file   Social Drivers of Health   Financial Resource Strain: Not on file  Food Insecurity: No Food Insecurity (12/18/2023)   Hunger Vital Sign    Worried About Running Out of Food in the Last Year: Never true    Ran Out of Food in the Last Year: Never true  Transportation Needs: No Transportation Needs (12/18/2023)   PRAPARE - Administrator, Civil Service (Medical): No    Lack of Transportation (Non-Medical): No  Physical Activity: Not on file  Stress: Not on file  Social Connections: Unknown (04/18/2022)   Received from [facility]   Social Network    Social Network: Not on file       Review of Systems  Vital Signs:   Advance Care Plan:   Physical Exam  Imaging: No results found.  Labs:  CBC: Recent Labs    08/15/23 0811 12/18/23 1202  WBC 4.1 3.2*  HGB 10.9* 11.1*  HCT 34.3* 33.9*  PLT 240 226    COAGS: Recent Labs    08/15/23 0811  INR 1.0    BMP: Recent Labs    12/18/23 1202  NA 140  K 4.8  CL 108  CO2 29  GLUCOSE 97  BUN 35*  CALCIUM 9.4  CREATININE 1.42*  GFRNONAA 49*    LIVER FUNCTION TESTS: Recent Labs    12/18/23 1202  BILITOT 0.4  AST 17  ALT 7  ALKPHOS 59  PROT 6.6  ALBUMIN 3.7    TUMOR MARKERS: No results for input(s): "AFPTM", "CEA", "CA199", "CHROMGRNA" in the last 8760 hours.  Assessment and Plan:  83 y.o. male ex smoker with PMH sig for CHF, CKD, DM,  first degree AV block, GERD, HTN, OA, HLD and prostate cancer initially diagnosed in 2018 with prior prostatectomy who presents now with recurrent disease. He underwent left RP LN bx by IR team on 08/15/23.  He  is scheduled today for port a cath placement to assist with treatment. Risks and benefits of image guided port-a-catheter placement was discussed with the patient including, but not limited to bleeding, infection, pneumothorax, or fibrin sheath development and need for additional procedures.  All of the patient's questions were answered, patient is agreeable to proceed. Consent signed and in chart.    Thank you for allowing our service to participate in Ian Dudley 's care.  Electronically Signed: D. Jeananne Rama, PA-C   12/31/2023, 4:58 PM      I spent a total of    25 Minutes in face to face in clinical consultation, greater than 50% of which was counseling/coordinating care for port a cath placement

## 2024-01-01 ENCOUNTER — Ambulatory Visit (HOSPITAL_COMMUNITY)
Admission: RE | Admit: 2024-01-01 | Discharge: 2024-01-01 | Disposition: A | Discharge status: Home / Self Care | Payer: Medicare Other | Source: Ambulatory Visit

## 2024-01-01 ENCOUNTER — Other Ambulatory Visit: Payer: Self-pay

## 2024-01-01 ENCOUNTER — Encounter (HOSPITAL_COMMUNITY): Payer: Self-pay

## 2024-01-01 DIAGNOSIS — E785 Hyperlipidemia, unspecified: Secondary | ICD-10-CM | POA: Diagnosis not present

## 2024-01-01 DIAGNOSIS — Z87891 Personal history of nicotine dependence: Secondary | ICD-10-CM | POA: Diagnosis not present

## 2024-01-01 DIAGNOSIS — C61 Malignant neoplasm of prostate: Secondary | ICD-10-CM | POA: Diagnosis not present

## 2024-01-01 DIAGNOSIS — E1022 Type 1 diabetes mellitus with diabetic chronic kidney disease: Secondary | ICD-10-CM | POA: Diagnosis not present

## 2024-01-01 DIAGNOSIS — I130 Hypertensive heart and chronic kidney disease with heart failure and stage 1 through stage 4 chronic kidney disease, or unspecified chronic kidney disease: Secondary | ICD-10-CM | POA: Insufficient documentation

## 2024-01-01 DIAGNOSIS — K219 Gastro-esophageal reflux disease without esophagitis: Secondary | ICD-10-CM | POA: Insufficient documentation

## 2024-01-01 DIAGNOSIS — N189 Chronic kidney disease, unspecified: Secondary | ICD-10-CM | POA: Diagnosis not present

## 2024-01-01 DIAGNOSIS — Z7984 Long term (current) use of oral hypoglycemic drugs: Secondary | ICD-10-CM | POA: Insufficient documentation

## 2024-01-01 DIAGNOSIS — I509 Heart failure, unspecified: Secondary | ICD-10-CM | POA: Diagnosis not present

## 2024-01-01 DIAGNOSIS — I440 Atrioventricular block, first degree: Secondary | ICD-10-CM | POA: Diagnosis not present

## 2024-01-01 DIAGNOSIS — Z8546 Personal history of malignant neoplasm of prostate: Secondary | ICD-10-CM | POA: Insufficient documentation

## 2024-01-01 DIAGNOSIS — Z452 Encounter for adjustment and management of vascular access device: Secondary | ICD-10-CM | POA: Diagnosis not present

## 2024-01-01 HISTORY — PX: IR IMAGING GUIDED PORT INSERTION: IMG5740

## 2024-01-01 LAB — GLUCOSE, CAPILLARY: Glucose-Capillary: 75 mg/dL (ref 70–99)

## 2024-01-01 MED ORDER — FENTANYL CITRATE (PF) 100 MCG/2ML IJ SOLN
INTRAMUSCULAR | Status: AC
  Filled 2024-01-01: qty 2

## 2024-01-01 MED ORDER — LIDOCAINE-EPINEPHRINE 1 %-1:100000 IJ SOLN
INTRAMUSCULAR | Status: AC
Start: 2024-01-01 — End: ?
  Filled 2024-01-01: qty 1

## 2024-01-01 MED ORDER — HEPARIN SOD (PORK) LOCK FLUSH 100 UNIT/ML IV SOLN
500.0000 [IU] | Freq: Once | INTRAVENOUS | Status: AC
  Administered 2024-01-01: 500 [IU] via INTRAVENOUS

## 2024-01-01 MED ORDER — SODIUM CHLORIDE 0.9 % IV SOLN: INTRAVENOUS | Status: DC

## 2024-01-01 MED ORDER — LIDOCAINE-EPINEPHRINE 1 %-1:100000 IJ SOLN
20.0000 mL | Freq: Once | INTRAMUSCULAR | Status: AC
  Administered 2024-01-01: 20 mL via INTRADERMAL

## 2024-01-01 MED ORDER — MIDAZOLAM HCL 2 MG/2ML IJ SOLN
INTRAMUSCULAR | Status: AC
  Filled 2024-01-01: qty 2

## 2024-01-01 MED ORDER — HEPARIN SOD (PORK) LOCK FLUSH 100 UNIT/ML IV SOLN
INTRAVENOUS | Status: AC
  Filled 2024-01-01: qty 5

## 2024-01-01 MED ORDER — MIDAZOLAM HCL 2 MG/2ML IJ SOLN
INTRAMUSCULAR | Status: AC | PRN
  Administered 2024-01-01 (×2): 1 mg via INTRAVENOUS

## 2024-01-01 MED ORDER — FENTANYL CITRATE (PF) 100 MCG/2ML IJ SOLN
INTRAMUSCULAR | Status: AC | PRN
  Administered 2024-01-01: 50 ug via INTRAVENOUS

## 2024-01-01 NOTE — Procedures (Signed)
Interventional Radiology Procedure Note  Procedure: Placement of a right IJ approach single lumen PowerPort.  Tip is positioned at the superior cavoatrial junction and catheter is ready for immediate use.  Complications: None Recommendations:  - Ok to shower tomorrow - Do not submerge for 7 days - Routine line care   Signed,  Yvone Neu. Loreta Ave, DO

## 2024-01-01 NOTE — Discharge Instructions (Signed)
For questions /concerns may call Interventional Radiology at [phone] or  Interventional Radiology clinic [phone]   You may remove your dressing and shower tomorrow afternoon  DO NOT use EMLA cream for 2 weeks after port placement as the cream will remove surgical glue on your incision.                                                                   Implanted Port Insertion, Care After The following information offers guidance on how to care for yourself after your procedure. Your health care provider may also give you more specific instructions. If you have problems or questions, contact your health care provider. What can I expect after the procedure? After the procedure, it is common to have: Discomfort at the port insertion site. Bruising on the skin over the port. This should improve over 3-4 days. Follow these instructions at home: [facility] care After your port is placed, you will get a manufacturer's information card. The card has information about your port. Keep this card with you at all times. Take care of the port as told by your health care provider. Ask your health care provider if you or a family member can get training for taking care of the port at home. A home health care nurse will be be available to help care for the port. Make sure to remember what type of port you have. Incision care  Follow instructions from your health care provider about how to take care of your port insertion site. Make sure you: Wash your hands with soap and water for at least 20 seconds before and after you change your bandage (dressing). If soap and water are not available, use hand sanitizer. Change your dressing as told by your health care provider. Leave stitches (sutures), skin glue, or adhesive strips in place. These skin closures may need to stay in place for 2 weeks or longer. If adhesive strip edges start to loosen and curl up, you may trim the loose edges. Do not remove adhesive  strips completely unless your health care provider tells you to do that. Check your port insertion site every day for signs of infection. Check for: Redness, swelling, or pain. Fluid or blood. Warmth. Pus or a bad smell. Activity Return to your normal activities as told by your health care provider. Ask your health care provider what activities are safe for you. You may have to avoid lifting. Ask your health care provider how much you can safely lift. General instructions Take over-the-counter and prescription medicines only as told by your health care provider. Do not take baths, swim, or use a hot tub until your health care provider approves. Ask your health care provider if you may take showers. You may only be allowed to take sponge baths. If you were given a sedative during the procedure, it can affect you for several hours. Do not drive or operate machinery until your health care provider says that it is safe. Wear a medical alert bracelet in case of an emergency. This will tell any health care providers that you have a port. Keep all follow-up visits. This is important. Contact a health care provider if: You cannot flush your port with saline as directed, or you cannot draw blood  from the port. You have a fever or chills. You have redness, swelling, or pain around your port insertion site. You have fluid or blood coming from your port insertion site. Your port insertion site feels warm to the touch. You have pus or a bad smell coming from the port insertion site. Get help right away if: You have chest pain or shortness of breath. You have bleeding from your port that you cannot control. These symptoms may be an emergency. Get help right away. Call 911. Do not wait to see if the symptoms will go away. Do not drive yourself to the hospital. Summary Take care of the port as told by your health care provider. Keep the manufacturer's information card with you at all times. Change your  dressing as told by your health care provider. Contact a health care provider if you have a fever or chills or if you have redness, swelling, or pain around your port insertion site. Keep all follow-up visits. This information is not intended to replace advice given to you by your health care provider. Make sure you discuss any questions you have with your health care provider. Document Revised: 05/24/2021 Document Reviewed: 05/24/2021 Elsevier Patient Education  2024 [employer]                                                         Moderate Conscious Sedation, Adult, Care After After the procedure, it is common to have: Sleepiness for a few hours. Impaired judgment for a few hours. Trouble with balance. Nausea or vomiting if you eat too soon. Follow these instructions at home: For the time period you were told by your health care provider:  Rest. Do not participate in activities where you could fall or become injured. Do not drive or use machinery. Do not drink alcohol. Do not take sleeping pills or medicines that cause drowsiness. Do not make important decisions or sign legal documents. Do not take care of children on your own. Eating and drinking Follow instructions from your health care provider about what you may eat and drink. Drink enough fluid to keep your urine pale yellow. If you vomit: Drink clear fluids slowly and in small amounts as you are able. Clear fluids include water, ice chips, low-calorie sports drinks, and fruit juice that has water added to it (diluted fruit juice). Eat light and bland foods in small amounts as you are able. These foods include bananas, applesauce, rice, lean meats, toast, and crackers. General instructions Take over-the-counter and prescription medicines only as told by your health care provider. Have a responsible adult stay with you for the time you are told. Do not use any products that contain nicotine or tobacco. These products  include cigarettes, chewing tobacco, and vaping devices, such as e-cigarettes. If you need help quitting, ask your health care provider. Return to your normal activities as told by your health care provider. Ask your health care provider what activities are safe for you. Your health care provider may give you more instructions. Make sure you know what you can and cannot do. Contact a health care provider if: You are still sleepy or having trouble with balance after 24 hours. You feel light-headed. You vomit every time you eat or drink. You get a rash. You have a fever. You have redness  or swelling around the IV site. Get help right away if: You have trouble breathing. You start to feel confused at home. These symptoms may be an emergency. Get help right away. Call 911. Do not wait to see if the symptoms will go away. Do not drive yourself to the hospital. This information is not intended to replace advice given to you by your health care provider. Make sure you discuss any questions you have with your health care provider. Document Revised: 06/05/2022 Document Reviewed: 06/05/2022 Elsevier Patient Education  2024 [employer].

## 2024-01-02 ENCOUNTER — Encounter: Payer: Self-pay | Admitting: Genetic Counselor

## 2024-01-02 DIAGNOSIS — Z1379 Encounter for other screening for genetic and chromosomal anomalies: Secondary | ICD-10-CM | POA: Insufficient documentation

## 2024-01-05 DIAGNOSIS — E119 Type 2 diabetes mellitus without complications: Secondary | ICD-10-CM | POA: Diagnosis not present

## 2024-01-07 NOTE — Progress Notes (Signed)
Pharmacist Chemotherapy Monitoring - Initial Assessment    Anticipated start date: 01/14/24   The following has been reviewed per standard work regarding the patient's treatment regimen: The patient's diagnosis, treatment plan and drug doses, and organ/hematologic function Lab orders and baseline tests specific to treatment regimen  The treatment plan start date, drug sequencing, and pre-medications Prior authorization status  Patient's documented medication list, including drug-drug interaction screen and prescriptions for anti-emetics and supportive care specific to the treatment regimen The drug concentrations, fluid compatibility, administration routes, and timing of the medications to be used The patient's access for treatment and lifetime cumulative dose history, if applicable  The patient's medication allergies and previous infusion related reactions, if applicable   Changes made to treatment plan:  N/A  Follow up needed:  N/A   Ian Dudley, RPH, 01/07/2024  1:31 PM

## 2024-01-11 ENCOUNTER — Inpatient Hospital Stay: Payer: Medicare Other

## 2024-01-11 DIAGNOSIS — E785 Hyperlipidemia, unspecified: Secondary | ICD-10-CM | POA: Insufficient documentation

## 2024-01-11 DIAGNOSIS — D539 Nutritional anemia, unspecified: Secondary | ICD-10-CM | POA: Insufficient documentation

## 2024-01-11 DIAGNOSIS — C7951 Secondary malignant neoplasm of bone: Secondary | ICD-10-CM | POA: Insufficient documentation

## 2024-01-11 DIAGNOSIS — I129 Hypertensive chronic kidney disease with stage 1 through stage 4 chronic kidney disease, or unspecified chronic kidney disease: Secondary | ICD-10-CM | POA: Insufficient documentation

## 2024-01-11 DIAGNOSIS — Z9079 Acquired absence of other genital organ(s): Secondary | ICD-10-CM | POA: Insufficient documentation

## 2024-01-11 DIAGNOSIS — Z923 Personal history of irradiation: Secondary | ICD-10-CM | POA: Insufficient documentation

## 2024-01-11 DIAGNOSIS — N1831 Chronic kidney disease, stage 3a: Secondary | ICD-10-CM | POA: Insufficient documentation

## 2024-01-11 DIAGNOSIS — C61 Malignant neoplasm of prostate: Secondary | ICD-10-CM | POA: Insufficient documentation

## 2024-01-11 DIAGNOSIS — Z79899 Other long term (current) drug therapy: Secondary | ICD-10-CM | POA: Insufficient documentation

## 2024-01-11 NOTE — Progress Notes (Signed)
 Patient Care Team: Jacqueline Matsu, MD as PCP - General (Pulmonary Disease) Arty Binning, MD (Inactive) as PCP - Cardiology (Cardiology) Katheleen Palmer, RN as Oncology Nurse Navigator  Clinic Day:  01/14/2024  Referring physician: Lowanda Ruddy, MD  ASSESSMENT & PLAN:   Assessment & Plan: Negative genetic testing.  A VUS in RAD51D was identified    ASSESSMENT & PLAN:  Ian Dudley is a 83 y.o.male with history of HTN, HLD,  being [facility] for recurrent prostate cancer. 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 He had rising PSA and enrolled into [facility] in 2021.  PSMA PET in September 2024 showed lymphadenopathy above and below the diaphragm.   Per records from medical history, patient was on Xtandi from April 2023 to present time.  He has been on ADT since 2022.  Most recent PSA in December 2024 was 99.  Testosterone  was 17. Consistent with CRPC.   Current diagnosis: mCRPC Initial diagnosis: 10/15/2007.  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 Extensive perineural invasion. PSA in 12/2007 was up to 21.  Progression: PSA 0.1 in 12/2008 and 0.2 in 07/2009.  Germline [facility] testing negative. VUS RAD51D Somatic testing: TMB low. pMMR/MSI stable. PDL1 negative. HRD negative. Previous Treatment: 01/22/2008 ([facility], Dr. Zannie Hey) prostatectomy 2010 salvage radiation 2021 enrolled into [facility] enzalutamide trial     The patient was counseled on the natural history of prostate cancer and the standard treatment options that are available for prostate cancer.  We discussed he has progressed on ADT/ARPI to mCRPC. The next line of treatment will be chemotherapy. His new PET showed main disease burden of lymphadenopathy.  Given his  older age, consider lower-dose docetaxel  every 2 weeks.  Pluvicto  can be an option given PSMA avid in the future.  We discussed treatment is palliative, not curative.  I later also discussed this with his daughter and son-in-law.  We also went over the side effects of docetaxel  chemotherapy again today.  He will proceed with treatment.  For cycle 1, will give an extra week off and see how he does and reassess before cycle 2.  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   Plan to start treatment today  Follow up on 3/2 with lab and assess tolerability.  Macrocytic anemia Adding B12, folate  CKD (chronic kidney disease) Increase hydration 60-70 oz per day Monitor with lab before each cycle.   Discussed goal of treatment is palliative. He reports has notarized document at home. Recommend a copy for upload.  Phone visit on 2/21. Lab, MD visit and then treatment on Monday 3/2.  The patient understands the plans discussed today and is in agreement with them.  He knows to contact our office if he develops concerns prior to his next appointment.  Lowanda Ruddy, MD  [facility] - A DEPT OF [facility] [address] [place] [address] Dept: [phone] Dept Fax: [phone]   No orders of the defined types were placed in this encounter.     CHIEF COMPLAINT:  CC: mCRPC  Current Treatment:  docetaxel   INTERVAL HISTORY:  Ian Dudley is here today for repeat clinical assessment. He denies  fevers or chills. He denies pain. His appetite is good. He is ready for chemotherapy.  I have reviewed the past medical history, past surgical history, social history and family history with the patient and they are unchanged from previous note.  ALLERGIES:  has no known allergies.  MEDICATIONS:  Current Outpatient Medications  Medication Sig  Dispense Refill   acetaminophen  (TYLENOL ) 500 MG tablet Take 500 mg by mouth every 6 (six) hours as needed.     ascorbic acid (VITAMIN C) 500 MG tablet Take 500 mg by mouth once a week.     dexamethasone  (DECADRON ) 4 MG tablet Take 2 tabs by mouth 2 times daily starting day before chemo. Then take 2 tabs daily for 2 days starting day after chemo. Take with food. 30 tablet 1   glipiZIDE (GLUCOTROL XL) 10 MG 24 hr tablet Take 10 mg by mouth 4 (four) times a week.     lidocaine -prilocaine  (EMLA ) cream Apply to affected area once 30 g 3   lisinopril -hydrochlorothiazide  (PRINZIDE ,ZESTORETIC ) 10-12.5 MG tablet Take 1 tablet by mouth daily. THIS IS THE LAST AUTHORIZED REFILL FOR THIS MEDICATION.  Please have future refills authorized by PCP 30 tablet 0   metFORMIN (GLUCOPHAGE-XR) 500 MG 24 hr tablet Take 500 mg by mouth daily.     ondansetron  (ZOFRAN ) 8 MG tablet Take 1 tablet (8 mg total) by mouth every 8 (eight) hours as needed for nausea or vomiting. 30 tablet 1   pioglitazone (ACTOS) 30 MG tablet Take 30 mg by mouth daily.     predniSONE  (DELTASONE ) 5 MG tablet Take 1 tablet (5 mg total) by mouth in the morning and at bedtime. 60 tablet 11   prochlorperazine  (COMPAZINE ) 10 MG tablet Take 1 tablet (10 mg total) by mouth every 6 (six) hours as needed for nausea or vomiting. 30 tablet 1   simvastatin (ZOCOR) 10 MG tablet Take 10 mg by mouth daily.     XTANDI 40 MG tablet Take 160 mg by mouth daily.     No current facility-administered medications for this visit.    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 Extensive perineural invasion.   12/2008 Tumor Marker   PSA 0.1   07/2009 Tumor Marker   PSA 0.2    11/2021 Tumor Marker   PSA 4.26   03/2022 Tumor Marker   PSA 12.2   03/2022 -  Chemotherapy   Started Ian Dudley Report he could not take continuously due to cost.   12/2022 Tumor Marker   PSA 26   03/2023 Tumor Marker   PSA 33   07/2023 Tumor Marker   PSA 73.3   07/23/2023 Imaging   CT AP new pathologic retrocrural and para-aortic adenopathy, with enlarging lower thoracic para-aortic adenopathy; no definite pathologic adenopathy in pelvis.      08/15/2023 Procedure   A. LYMPH NODE, LEFT SIDED RETROPERITONEAL NODAL MASS, NEEDLE CORE BIOPSY:      Metastatic carcinoma, consistent with prostate primary.       See comment.   COMMENT:   Immunohistochemical stains for NKX3.1 and prostein were performed and  are positive in the tumor cells. The findings are supportive of  metastatic carcinoma with prostate primary.   Controls worked appropriately.    08/21/2023  PET scan   PSMA PET IMPRESSION: Prior prostatectomy. No evidence of local tumor recurrence.   Metastatic lymphadenopathy in the left lower neck, mediastinum, bilateral retrocrural regions, and abdominal retroperitoneum in left para-aortic region.   No evidence of pelvic metastatic lymphadenopathy or skeletal metastases.   Emphysema (ICD10-J43.9).   08/2023 Miscellaneous   Consultation with radiation oncology.  PSMA PET performed.  Radiation treatment is not a suitable option due to the extent of the disease.    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  RAD51D  p.R165W (c.493C>T) VUS identified.  The report date is January 01, 2024.  The [facility] CancerNext+RNAinsight Panel includes sequencing, rearrangement analysis, and RNA analysis for the following 39 genes: APC, ATM, BAP1, BARD1, BMPR1A, BRCA1, BRCA2, BRIP1,  CDH1, CDKN2A, CHEK2, FH, FLCN, MET, MLH1, MSH2, MSH6, MUTYH, NF1, NTHL1, PALB2, PMS2, PTEN, RAD51C, RAD51D, SMAD4, STK11, TP53, TSC1, TSC2, and VHL (sequencing and deletion/duplication); AXIN2, HOXB13, MBD4, MSH3, POLD1 and POLE (sequencing only); EPCAM and GREM1 (deletion/duplication only).    01/14/2024 -  Chemotherapy   Patient is on Treatment Plan : PROSTATE Docetaxel  (75) + Prednisone  q14d         REVIEW OF SYSTEMS:   All relevant systems were reviewed with the patient and are negative.   VITALS:  Blood pressure (!) 137/51, pulse 71, temperature (!) 97.3 F (36.3 C), temperature source Temporal, resp. rate 17, weight 209 lb 1.6 oz (94.8 kg), SpO2 100%.  Wt Readings from Last 3 Encounters:  01/14/24 209 lb 1.6 oz (94.8 kg)  01/01/24 204 lb (92.5 kg)  12/18/23 209 lb 3.2 oz (94.9 kg)    Body mass index is 32.27 kg/m.  Performance status (ECOG): 1 - Symptomatic but completely ambulatory  PHYSICAL EXAM:   GENERAL:alert, no distress and comfortable SKIN: skin color normal, no rashes  EYES: normal, sclera clear OROPHARYNX: no exudate, no erythema    NECK: supple,  non-tender, without nodularity.  Mediport in place.  No erythema LYMPH:  left palpable cervical lymphadenopathy LUNGS: clear to auscultation with normal breathing effort.  No wheeze or rales HEART: regular rate & rhythm and no murmurs and no lower extremity edema ABDOMEN: abdomen soft, non-tender and nondistended Musculoskeletal: no edema  LABORATORY DATA:  I have reviewed the data as listed    Component Value Date/Time   NA 140 01/14/2024 0842   K 4.0 01/14/2024 0842   CL 108 01/14/2024 0842   CO2 25 01/14/2024 0842   GLUCOSE 139 (H) 01/14/2024 0842   BUN 35 (H) 01/14/2024 0842   CREATININE 1.42 (H) 01/14/2024 0842   CREATININE 1.17 11/02/2015 0914   CALCIUM 8.7 (L) 01/14/2024 0842   PROT 6.4 (L) 01/14/2024 0842   ALBUMIN 3.6 01/14/2024 0842   AST 16 01/14/2024 0842   ALT 8 01/14/2024 0842   ALKPHOS 65  01/14/2024 0842   BILITOT 0.4 01/14/2024 0842   GFRNONAA 49 (L) 01/14/2024 0842    No results found for: "SPEP", "UPEP"  Lab Results  Component Value Date   WBC 2.8 (L) 01/14/2024   NEUTROABS 2.2 01/14/2024   HGB 10.4 (L) 01/14/2024   HCT 31.6 (L) 01/14/2024   MCV 98.8 01/14/2024   PLT 214 01/14/2024      Chemistry      Component Value Date/Time   NA  140 01/14/2024 0842   K 4.0 01/14/2024 0842   CL 108 01/14/2024 0842   CO2 25 01/14/2024 0842   BUN 35 (H) 01/14/2024 0842   CREATININE 1.42 (H) 01/14/2024 0842   CREATININE 1.17 11/02/2015 0914      Component Value Date/Time   CALCIUM 8.7 (L) 01/14/2024 0842   ALKPHOS 65 01/14/2024 0842   AST 16 01/14/2024 0842   ALT 8 01/14/2024 0842   BILITOT 0.4 01/14/2024 0842       RADIOGRAPHIC STUDIES: I have personally reviewed the radiological images as listed and agreed with the findings in the report. NM PET (PSMA) SKULL TO MID THIGH Result Date: 01/03/2024 CLINICAL DATA:  Prostate carcinoma with biochemical recurrence. Gleason 8 adenocarcinoma on prostatectomy 2009. EXAM: NUCLEAR MEDICINE PET SKULL BASE TO THIGH TECHNIQUE: 8.1 mCi F18 Piflufolastat (Pylarify) was injected intravenously. Full-ring PET imaging was performed from the skull base to thigh after the radiotracer. CT data was obtained and used for attenuation correction and anatomic localization. COMPARISON:  PSMA PET scan 08/21/2023 FINDINGS: NECK Enlarged intensely radiotracer avid LEFT supraclavicular lymph nodes again noted with SUV max equal 131 compared SUV max equal 96. Intense radiotracer avid nodes are mildly increase in size. For example LEFT supraclavicular node measuring 20 mm (image 52/4 compares to 15 mm. Incidental CT finding: None. CHEST Intensely radiotracer avid mediastinal lymph nodes again noted. Dominant prevascular node measures 28 mm compared to 24 mm with SUV max equal 112 compared SUV max equal 94. Similar periaortic radiotracer avid nodes along the  descending thoracic aorta. Incidental CT finding: No suspicious pulmonary nodules. ABDOMEN/PELVIS Prostate: No focal activity in prostatectomy bed. Lymph nodes: No radiotracer avid pelvic lymph nodes. Again demonstrated intense radiotracer avid lymph nodes at the aortic bifurcation, along the abdominal aorta and extending to the retrocrural nodal station. Example retrocrural node measuring 18 mm (image 110) with SUV max equal 132 compares to 13 mm with SUV max equal 130. Bulky nodal mass LEFT aorta just above the bifurcation measures 34 mm (image 138) compared to 29 mm with SUV max equal 143 SUV max equal 168. No new sites abdominal lymphadenopathy. No abnormal radiotracer activity in the liver. Liver: No evidence of liver metastasis. Incidental CT finding: None. SKELETON A single radiotracer avid lesion within the skeleton involving the T2 vertebral body LEFT transverse process (image 52). Activity increased to SUV max equal 20 compared SUV max equal 10. IMPRESSION: 1. Intensely radiotracer avid large LEFT supraclavicular, mediastinal, and retroperitoneal lymph nodes are mildly increased in size and similar to mildly increased in intense radiotracer activity. Findings consistent with mild progression metastatic adenopathy. 2. No new sites of metastatic adenopathy. 3. 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 4. No liver metastasis.  No lung metastasis Electronically Signed   By: Deboraha Fallow M.D.   On: 01/03/2024 10:06   IR IMAGING GUIDED PORT INSERTION Result Date: 01/01/2024 INDICATION: 82 year old male presents for port catheter placement EXAM: IMAGE GUIDED RIGHT IJ PORT CATHETER PLACEMENT MEDICATIONS: None ANESTHESIA/SEDATION: Moderate (conscious) sedation was employed during this procedure. A total of Versed  2.0 mg and Fentanyl  50 mcg was administered intravenously. Moderate Sedation Time: 17 minutes. The patient's level of consciousness and vital signs were monitored  continuously by radiology nursing throughout the procedure under my direct supervision. FLUOROSCOPY TIME:  Fluoroscopy Time:   (1 mGy). COMPLICATIONS: None PROCEDURE: Informed written consent was obtained from the patient after a discussion of the risks, benefits, and alternatives to treatment. Questions regarding  the procedure were encouraged and answered. The right neck and chest were prepped with chlorhexidine  in a sterile fashion, and a sterile drape was applied covering the operative field. Maximum barrier sterile technique with sterile gowns and gloves were used for the procedure. A timeout was performed prior to the initiation of the procedure. Ultrasound survey was performed with images stored and sent to PACs. Right IJ vein documented to be patent. The right neck and chest was prepped with chlorhexidine , and draped in the usual sterile fashion using maximum barrier technique (cap and mask, sterile gown, sterile gloves, large sterile sheet, hand hygiene and cutaneous antiseptic). Local anesthesia was attained by infiltration with 1% lidocaine  without epinephrine . Ultrasound demonstrated patency of the right internal jugular vein, and this was documented with an image. Under real-time ultrasound guidance, this vein was accessed with a 21 gauge micropuncture needle and image documentation was performed. A small dermatotomy was made at the access site with an 11 scalpel. A 0.018" wire was advanced into the SVC and used to estimate the length of the internal catheter. The access needle exchanged for a 25F micropuncture vascular sheath. The 0.018" wire was then removed and a 0.035" wire advanced into the IVC. An appropriate location for the subcutaneous reservoir was selected below the clavicle and an incision was made through the skin and underlying soft tissues. The subcutaneous tissues were then dissected using a combination of blunt and sharp surgical technique and a pocket was formed. A single lumen power  injectable portacatheter was then tunneled through the subcutaneous tissues from the pocket to the dermatotomy and the port reservoir placed within the subcutaneous pocket. The venous access site was then serially dilated and a peel away vascular sheath placed over the wire. The wire was removed and the port catheter advanced into position under fluoroscopic guidance. The catheter tip is positioned in the cavoatrial junction. This was documented with a spot image. The portacatheter was then tested and found to flush and aspirate well. The port was flushed with saline followed by 100 units/mL heparinized saline. The pocket was then closed in two layers using first subdermal inverted interrupted absorbable sutures followed by a running subcuticular suture. The epidermis was then sealed with Dermabond. The dermatotomy at the venous access site was also seal with Dermabond. Patient tolerated the procedure well and remained hemodynamically stable throughout. No complications encountered and no significant blood loss encountered IMPRESSION: Status post right IJ port catheter placement. Signed, Marciano Settles. Rexine Cater, RPVI [facility] Rad[facility] Electronically Signed   By: Myrlene Asper D.O.   On: 01/01/2024 15:59

## 2024-01-13 DIAGNOSIS — N189 Chronic kidney disease, unspecified: Secondary | ICD-10-CM | POA: Insufficient documentation

## 2024-01-13 DIAGNOSIS — D539 Nutritional anemia, unspecified: Secondary | ICD-10-CM | POA: Insufficient documentation

## 2024-01-13 NOTE — Assessment & Plan Note (Signed)
 Docetaxel  50 mg/m every 2 weeks New baseline PSA, testosterone , CBC and CMP.   Plan to start treatment today  Follow up on 3/2 with lab and assess tolerability.

## 2024-01-13 NOTE — Assessment & Plan Note (Addendum)
 Increase hydration 60-70 oz per day Monitor with lab before each cycle.

## 2024-01-13 NOTE — Assessment & Plan Note (Addendum)
 Adding B12, folate

## 2024-01-14 ENCOUNTER — Inpatient Hospital Stay: Payer: Medicare Other

## 2024-01-14 ENCOUNTER — Other Ambulatory Visit: Payer: Self-pay

## 2024-01-14 ENCOUNTER — Telehealth: Payer: Self-pay

## 2024-01-14 VITALS — BP 137/51 | HR 71 | Temp 97.3°F | Resp 17 | Wt 209.1 lb

## 2024-01-14 VITALS — BP 150/63 | HR 66 | Temp 97.9°F | Resp 19

## 2024-01-14 DIAGNOSIS — C61 Malignant neoplasm of prostate: Secondary | ICD-10-CM

## 2024-01-14 DIAGNOSIS — Z923 Personal history of irradiation: Secondary | ICD-10-CM | POA: Diagnosis not present

## 2024-01-14 DIAGNOSIS — D539 Nutritional anemia, unspecified: Secondary | ICD-10-CM

## 2024-01-14 DIAGNOSIS — Z9079 Acquired absence of other genital organ(s): Secondary | ICD-10-CM | POA: Diagnosis not present

## 2024-01-14 DIAGNOSIS — C7951 Secondary malignant neoplasm of bone: Secondary | ICD-10-CM | POA: Diagnosis not present

## 2024-01-14 DIAGNOSIS — N1831 Chronic kidney disease, stage 3a: Secondary | ICD-10-CM

## 2024-01-14 DIAGNOSIS — C799 Secondary malignant neoplasm of unspecified site: Secondary | ICD-10-CM | POA: Diagnosis not present

## 2024-01-14 DIAGNOSIS — I129 Hypertensive chronic kidney disease with stage 1 through stage 4 chronic kidney disease, or unspecified chronic kidney disease: Secondary | ICD-10-CM | POA: Diagnosis not present

## 2024-01-14 DIAGNOSIS — Z79899 Other long term (current) drug therapy: Secondary | ICD-10-CM | POA: Diagnosis not present

## 2024-01-14 DIAGNOSIS — E785 Hyperlipidemia, unspecified: Secondary | ICD-10-CM | POA: Diagnosis not present

## 2024-01-14 LAB — CBC WITH DIFFERENTIAL (CANCER CENTER ONLY)
Abs Immature Granulocytes: 0.02 10*3/uL (ref 0.00–0.07)
Basophils Absolute: 0 10*3/uL (ref 0.0–0.1)
Basophils Relative: 0 %
Eosinophils Absolute: 0 10*3/uL (ref 0.0–0.5)
Eosinophils Relative: 0 %
HCT: 31.6 % — ABNORMAL LOW (ref 39.0–52.0)
Hemoglobin: 10.4 g/dL — ABNORMAL LOW (ref 13.0–17.0)
Immature Granulocytes: 1 %
Lymphocytes Relative: 17 %
Lymphs Abs: 0.5 10*3/uL — ABNORMAL LOW (ref 0.7–4.0)
MCH: 32.5 pg (ref 26.0–34.0)
MCHC: 32.9 g/dL (ref 30.0–36.0)
MCV: 98.8 fL (ref 80.0–100.0)
Monocytes Absolute: 0.1 10*3/uL (ref 0.1–1.0)
Monocytes Relative: 4 %
Neutro Abs: 2.2 10*3/uL (ref 1.7–7.7)
Neutrophils Relative %: 78 %
Platelet Count: 214 10*3/uL (ref 150–400)
RBC: 3.2 MIL/uL — ABNORMAL LOW (ref 4.22–5.81)
RDW: 14.8 % (ref 11.5–15.5)
WBC Count: 2.8 10*3/uL — ABNORMAL LOW (ref 4.0–10.5)
nRBC: 0 % (ref 0.0–0.2)

## 2024-01-14 LAB — CMP (CANCER CENTER ONLY)
ALT: 8 U/L (ref 0–44)
AST: 16 U/L (ref 15–41)
Albumin: 3.6 g/dL (ref 3.5–5.0)
Alkaline Phosphatase: 65 U/L (ref 38–126)
Anion gap: 7 (ref 5–15)
BUN: 35 mg/dL — ABNORMAL HIGH (ref 8–23)
CO2: 25 mmol/L (ref 22–32)
Calcium: 8.7 mg/dL — ABNORMAL LOW (ref 8.9–10.3)
Chloride: 108 mmol/L (ref 98–111)
Creatinine: 1.42 mg/dL — ABNORMAL HIGH (ref 0.61–1.24)
GFR, Estimated: 49 mL/min — ABNORMAL LOW (ref >60)
Glucose, Bld: 139 mg/dL — ABNORMAL HIGH (ref 70–99)
Potassium: 4 mmol/L (ref 3.5–5.1)
Sodium: 140 mmol/L (ref 135–145)
Total Bilirubin: 0.4 mg/dL (ref 0.0–1.2)
Total Protein: 6.4 g/dL — ABNORMAL LOW (ref 6.5–8.1)

## 2024-01-14 LAB — FOLATE: Folate: 7.4 ng/mL (ref >5.9)

## 2024-01-14 LAB — VITAMIN B12: Vitamin B-12: 101 pg/mL — ABNORMAL LOW (ref 180–914)

## 2024-01-14 MED ORDER — HEPARIN SOD (PORK) LOCK FLUSH 100 UNIT/ML IV SOLN
500.0000 [IU] | Freq: Once | INTRAVENOUS | Status: AC | PRN
Start: 2024-01-14 — End: 2024-01-14
  Administered 2024-01-14: 500 [IU]

## 2024-01-14 MED ORDER — SODIUM CHLORIDE 0.9 % IV SOLN
50.0000 mg/m2 | Freq: Once | INTRAVENOUS | Status: AC
  Administered 2024-01-14: 106 mg via INTRAVENOUS
  Filled 2024-01-14: qty 10.6

## 2024-01-14 MED ORDER — DEXAMETHASONE SODIUM PHOSPHATE 10 MG/ML IJ SOLN
10.0000 mg | Freq: Once | INTRAMUSCULAR | Status: AC
  Administered 2024-01-14: 10 mg via INTRAVENOUS
  Filled 2024-01-14: qty 1

## 2024-01-14 MED ORDER — SODIUM CHLORIDE 0.9 % IV SOLN: INTRAVENOUS | Status: DC

## 2024-01-14 MED ORDER — SODIUM CHLORIDE 0.9% FLUSH
10.0000 mL | INTRAVENOUS | Status: DC | PRN
Start: 2024-01-14 — End: 2024-01-14
  Administered 2024-01-14: 10 mL

## 2024-01-14 MED ORDER — SODIUM CHLORIDE 0.9% FLUSH
10.0000 mL | Freq: Once | INTRAVENOUS | Status: AC
  Administered 2024-01-14: 10 mL

## 2024-01-14 NOTE — Telephone Encounter (Signed)
 TC to inform of the below message by Dr. Alita [facility]. Pt verbalizes understanding and states he will get started on Vitamin B12 as directed.

## 2024-01-14 NOTE — Telephone Encounter (Signed)
-----   Message from Lowanda Ruddy sent at 01/14/2024 12:25 PM EST ----- Hi Mandy Please let him know B12 deficiency.  Take B12 1000 mcg once daily.  He is still in the infusion room thank you.

## 2024-01-14 NOTE — Patient Instructions (Signed)
 [facility] - A DEPT OF [facility]  Discharge Instructions: Thank you for choosing [facility] to provide your oncology and hematology care.   If you have a lab appointment with the Cancer Center, please go directly to the Cancer Center and check in at the registration area.   Wear comfortable clothing and clothing appropriate for easy access to any Portacath or PICC line.   We strive to give you quality time with your provider. You may need to reschedule your appointment if you arrive late (15 or more minutes).  Arriving late affects you and other patients whose appointments are after yours.  Also, if you miss three or more appointments without notifying the office, you may be dismissed from the clinic at the provider's discretion.      For prescription refill requests, have your pharmacy contact our office and allow 72 hours for refills to be completed.    Today you received the following chemotherapy and/or immunotherapy agents: Docetaxel  (Taxotere )      To help prevent nausea and vomiting after your treatment, we encourage you to take your nausea medication as directed.  BELOW ARE SYMPTOMS THAT SHOULD BE REPORTED IMMEDIATELY: *FEVER GREATER THAN 100.4 F (38 C) OR HIGHER *CHILLS OR SWEATING *NAUSEA AND VOMITING THAT IS NOT CONTROLLED WITH YOUR NAUSEA MEDICATION *UNUSUAL SHORTNESS OF BREATH *UNUSUAL BRUISING OR BLEEDING *URINARY PROBLEMS (pain or burning when urinating, or frequent urination) *BOWEL PROBLEMS (unusual diarrhea, constipation, pain near the anus) TENDERNESS IN MOUTH AND THROAT WITH OR WITHOUT PRESENCE OF ULCERS (sore throat, sores in mouth, or a toothache) UNUSUAL RASH, SWELLING OR PAIN  UNUSUAL VAGINAL DISCHARGE OR ITCHING   Items with * indicate a potential emergency and should be followed up as soon as possible or go to the Emergency Department if any problems should occur.  Please show the CHEMOTHERAPY ALERT CARD or  IMMUNOTHERAPY ALERT CARD at check-in to the Emergency Department and triage nurse.  Should you have questions after your visit or need to cancel or reschedule your appointment, please contact [facility] OF [facility]  Dept: [phone]  and follow the prompts.  Office hours are 8:00 a.m. to 4:30 p.m. Monday - Friday. Please note that voicemails left after 4:00 p.m. may not be returned until the following business day.  We are closed weekends and major holidays. You have access to a nurse at all times for urgent questions. Please call the main number to the clinic Dept: [phone] and follow the prompts.   For any non-urgent questions, you may also contact your provider using MyChart. We now offer e-Visits for anyone 65 and older to request care online for non-urgent symptoms. For details visit [email or web address].   Also download the MyChart app! Go to the app store, search "MyChart", open the app, select [place], and log in with your MyChart username and password.

## 2024-01-15 LAB — TESTOSTERONE: Testosterone: <3 ng/dL — ABNORMAL LOW (ref 264–916)

## 2024-01-15 LAB — PROSTATE-SPECIFIC AG, SERUM (LABCORP): Prostate Specific Ag, Serum: 124 ng/mL — ABNORMAL HIGH (ref 0.0–4.0)

## 2024-01-15 NOTE — Telephone Encounter (Signed)
-----   Message from Nurse Reece Levy sent at 01/14/2024  1:33 PM EST ----- Regarding: Dr. Cherly Hensen 1st time Docetaxel f/u tol well Dr. Cherly Hensen 1st time Docetaxel, Pt call back due. Pt tolerated tx well without incident.

## 2024-01-15 NOTE — Telephone Encounter (Signed)
Called pt to see how he did with his recent treatment.  He reports doing well & denies any problems.  He knows how to reach [place] if needed & knows reasons to call.  He knows his next appt.

## 2024-01-16 ENCOUNTER — Other Ambulatory Visit: Payer: Self-pay

## 2024-01-19 ENCOUNTER — Other Ambulatory Visit: Payer: Self-pay

## 2024-01-24 NOTE — Addendum Note (Signed)
Encounter addended by: Edward Qualia on: 01/24/2024 12:40 PM  Actions taken: Imaging Exam ended

## 2024-01-29 ENCOUNTER — Other Ambulatory Visit: Payer: Self-pay

## 2024-01-29 DIAGNOSIS — C61 Malignant neoplasm of prostate: Secondary | ICD-10-CM

## 2024-01-29 NOTE — Progress Notes (Signed)
 Called and spoke to patient, tolerated first cycle well.  Will proceed with treatment as planned next week after visit and labs.

## 2024-02-02 DIAGNOSIS — E119 Type 2 diabetes mellitus without complications: Secondary | ICD-10-CM | POA: Diagnosis not present

## 2024-02-04 DIAGNOSIS — D519 Vitamin B12 deficiency anemia, unspecified: Secondary | ICD-10-CM | POA: Insufficient documentation

## 2024-02-04 NOTE — Assessment & Plan Note (Signed)
 Docetaxel 50 mg/m every 2 weeks Monitor PSA, testosterone, CBC and CMP.   Continue treatment today

## 2024-02-04 NOTE — Assessment & Plan Note (Signed)
 From b12 deficiency. Normal folate

## 2024-02-04 NOTE — Assessment & Plan Note (Signed)
  Supportive baseline bone mineral density study (previous fracture) calcium (1000-1200 mg daily from food and supplements) and vitamin D3 (1000 IU daily) Control and prevent diabetes Aggressive cardiovascular risk management exercises (30 minutes per day) Limit alcohol consumption and avoid smoking

## 2024-02-04 NOTE — Assessment & Plan Note (Signed)
 B12 1000 mcg daily

## 2024-02-04 NOTE — Assessment & Plan Note (Signed)
 Increase hydration 60-70 oz per day Monitor with lab before each cycle.

## 2024-02-04 NOTE — Progress Notes (Unsigned)
 Patient Care Team: Corine Shelter, MD as PCP - General (Pulmonary Disease) Lyn Records, MD (Inactive) as PCP - Cardiology (Cardiology) Cherlyn Cushing, RN as Oncology Nurse Navigator  Clinic Day:  02/05/2024  Referring physician: Corine Shelter, MD  ASSESSMENT & PLAN:   Assessment & Plan: Ian Dudley is a 83 y.o.male with history of HTN, HLD, being follow up at [facility] for recurrent prostate cancer. 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 He had rising PSA and enrolled into [facility] in 2021. PSMA PET in September 2024 showed lymphadenopathy above and below the diaphragm. Repeat staging in 12/2023 showed progression metastatic adenopathy. He started docetaxel on 01/14/24 at reduced dose due to his age.  Negative genetic testing on the CancerNext+RNAinsight panel. RAD51D p.R165W (c.493C>T) VUS identified.   Current diagnosis: mCRPC Initial diagnosis: 10/15/2007.  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 Extensive perineural invasion. PSA in 12/2007 was up to 21.  Progression: PSA 0.1 in 12/2008 and 0.2 in 07/2009.  Germline Ambry testing negative. VUS RAD51D Somatic testing: TMB low. pMMR/MSI stable. PDL1 negative. HRD negative. Previous treatment: prostatectomy, salvage radiation, ADT/ARPI Current treatment: docetaxel.  ADT at [facility] every 6 months.  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   Continue treatment today   At risk for side effect of medication  Supportive baseline bone mineral density study (previous fracture) calcium (1000-1200 mg daily from food and supplements) and vitamin D3 (1000 IU daily) Control and prevent  diabetes Aggressive cardiovascular risk management exercises (30 minutes per day) Limit alcohol consumption and avoid smoking  B12 deficiency anemia B12 1000 mcg daily  Macrocytic anemia From b12 deficiency. Normal folate  CKD (chronic kidney disease) Increase hydration 60-70 oz per day Monitor with lab before each cycle.    The patient understands the plans discussed today and is in agreement with them.  He knows to contact our office if he develops concerns prior to his next appointment.  Melven Sartorius, MD  [facility] [address] [place] [address] Dept: [phone] Dept Fax: [phone]   Orders Placed This Encounter  Procedures   Vitamin B12    Standing Status:   Future    Expected Date:   02/18/2024    Expiration Date:   02/17/2025   CBC with Differential (Cancer Center Only)    Standing Status:   Future    Expected Date:   03/17/2024    Expiration Date:   03/17/2025   Prostate-Specific AG, Serum    Standing Status:   Future    Expected Date:   03/17/2024    Expiration Date:   03/17/2025   Testosterone    Standing Status:   Future    Expected Date:   03/17/2024    Expiration Date:   03/17/2025   CMP (Cancer Center only)    Standing Status:   Future    Expected Date:   03/17/2024    Expiration Date:   03/17/2025   CBC with Differential (Cancer Center Only)    Standing Status:   Future    Expected Date:   03/31/2024    Expiration Date:   03/31/2025   Prostate-Specific AG, Serum    Standing Status:   Future  Expected Date:   03/31/2024    Expiration Date:   03/31/2025   Testosterone    Standing Status:   Future    Expected Date:   03/31/2024    Expiration Date:   03/31/2025   CMP (Cancer Center only)    Standing Status:   Future    Expected Date:   03/31/2024    Expiration Date:   03/31/2025   CBC with Differential (Cancer Center Only)    Standing Status:   Future    Expected Date:   04/14/2024    Expiration Date:   04/14/2025    Prostate-Specific AG, Serum    Standing Status:   Future    Expected Date:   04/14/2024    Expiration Date:   04/14/2025   Testosterone    Standing Status:   Future    Expected Date:   04/14/2024    Expiration Date:   04/14/2025   CMP (Cancer Center only)    Standing Status:   Future    Expected Date:   04/14/2024    Expiration Date:   04/14/2025   CBC with Differential (Cancer Center Only)    Standing Status:   Future    Expected Date:   04/28/2024    Expiration Date:   04/28/2025   Prostate-Specific AG, Serum    Standing Status:   Future    Expected Date:   04/28/2024    Expiration Date:   04/28/2025   Testosterone    Standing Status:   Future    Expected Date:   04/28/2024    Expiration Date:   04/28/2025   CMP (Cancer Center only)    Standing Status:   Future    Expected Date:   04/28/2024    Expiration Date:   04/28/2025      CHIEF COMPLAINT:  CC: mCRPC  Current Treatment:  docetaxel/ADT  INTERVAL HISTORY:  Ian Dudley is here today for repeat clinical assessment. He denies fevers or chills. He denies pain. His appetite is good. Overall he tolerated first cycle well.  He has been taking b12 for about 3 weeks. Not a vegetarian. No bloody or dark stool.  I have reviewed the past medical history, past surgical history, social history and family history with the patient and they are unchanged from previous note.  ALLERGIES:  has no known allergies.  MEDICATIONS:  Current Outpatient Medications  Medication Sig Dispense Refill   acetaminophen (TYLENOL) 500 MG tablet Take 500 mg by mouth every 6 (six) hours as needed.     ascorbic acid (VITAMIN C) 500 MG tablet Take 500 mg by mouth once a week.     cyanocobalamin (VITAMIN B12) 1000 MCG tablet Take 1,000 mcg by mouth daily.     dexamethasone (DECADRON) 4 MG tablet Take 2 tabs by mouth 2 times daily starting day before chemo. Then take 2 tabs daily for 2 days starting day after chemo. Take with food. 30 tablet 1   glipiZIDE (GLUCOTROL  XL) 10 MG 24 hr tablet Take 10 mg by mouth 4 (four) times a week.     lidocaine-prilocaine (EMLA) cream Apply to affected area once 30 g 3   lisinopril-hydrochlorothiazide (PRINZIDE,ZESTORETIC) 10-12.5 MG tablet Take 1 tablet by mouth daily. THIS IS THE LAST AUTHORIZED REFILL FOR THIS MEDICATION.  Please have future refills authorized by PCP 30 tablet 0   metFORMIN (GLUCOPHAGE-XR) 500 MG 24 hr tablet Take 500 mg by mouth daily.     ondansetron (ZOFRAN) 8 MG tablet Take  1 tablet (8 mg total) by mouth every 8 (eight) hours as needed for nausea or vomiting. 30 tablet 1   pioglitazone (ACTOS) 30 MG tablet Take 30 mg by mouth daily.     predniSONE (DELTASONE) 5 MG tablet Take 1 tablet (5 mg total) by mouth in the morning and at bedtime. 60 tablet 11   prochlorperazine (COMPAZINE) 10 MG tablet Take 1 tablet (10 mg total) by mouth every 6 (six) hours as needed for nausea or vomiting. 30 tablet 1   simvastatin (ZOCOR) 10 MG tablet Take 10 mg by mouth daily.     No current facility-administered medications for this visit.   Facility-Administered Medications Ordered in Other Visits  Medication Dose Route Frequency Provider Last Rate Last Admin   0.9 %  sodium chloride infusion   Intravenous Continuous Melven Sartorius, MD 10 mL/hr at 02/05/24 1149 New Bag at 02/05/24 1149   DOCEtaxel (TAXOTERE) 106 mg in sodium chloride 0.9 % 250 mL chemo infusion  50 mg/m2 (Treatment Plan Recorded) Intravenous Once Melven Sartorius, MD 261 mL/hr at 02/05/24 1235 106 mg at 02/05/24 1235   heparin lock flush 100 unit/mL  500 Units Intracatheter Once PRN Melven Sartorius, MD       sodium chloride flush (NS) 0.9 % injection 10 mL  10 mL Intracatheter PRN Melven Sartorius, MD        HISTORY OF PRESENT ILLNESS:   Oncology History  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 Merilynn Finland) prostatectomy  Gleason 4+4 prostatic adenocarcinoma with  extracapsular extension on right side, negative margins and negative seminal vesicle invasion. Extensive perineural invasion.   12/2008 Tumor Marker   PSA 0.1   07/2009 Tumor Marker   PSA 0.2   11/2021 Tumor Marker   PSA 4.26   03/2022 Tumor Marker   PSA 12.2   03/2022 -  Chemotherapy   Started Diana Eves Report he could not take continuously due to cost.   12/2022 Tumor Marker   PSA 26   03/2023 Tumor Marker   PSA 33   07/2023 Tumor Marker   PSA 73.3   07/23/2023 Imaging   CT AP new pathologic retrocrural and para-aortic adenopathy, with enlarging lower thoracic para-aortic adenopathy; no definite pathologic adenopathy in pelvis.      08/15/2023 Procedure   A. LYMPH NODE, LEFT SIDED RETROPERITONEAL NODAL MASS, NEEDLE CORE BIOPSY:      Metastatic carcinoma, consistent with prostate primary.       See comment.   COMMENT:   Immunohistochemical stains for NKX3.1 and prostein were performed and  are positive in the tumor cells. The findings are supportive of  metastatic carcinoma with prostate primary.   Controls worked appropriately.    08/21/2023 PET scan   PSMA PET IMPRESSION: Prior prostatectomy. No evidence of local tumor recurrence.   Metastatic lymphadenopathy in the left lower neck, mediastinum, bilateral retrocrural regions, and abdominal retroperitoneum in left para-aortic region.   No evidence of pelvic metastatic lymphadenopathy or skeletal metastases.   Emphysema (ICD10-J43.9).   08/2023 Miscellaneous   Consultation with radiation oncology.  PSMA PET performed.  Radiation treatment is not a suitable option due to the extent of the disease.    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  RAD51D  p.R165W (c.493C>T) VUS identified.  The report date is  January 01, 2024.  The [facility] CancerNext+RNAinsight Panel includes sequencing, rearrangement analysis, and RNA analysis for the following 39 genes: APC, ATM, BAP1, BARD1, BMPR1A, BRCA1, BRCA2, BRIP1, CDH1, CDKN2A, CHEK2, FH, FLCN, MET, MLH1, MSH2, MSH6, MUTYH, NF1, NTHL1, PALB2, PMS2, PTEN, RAD51C, RAD51D, SMAD4, STK11, TP53, TSC1, TSC2, and VHL (sequencing and deletion/duplication); AXIN2, HOXB13, MBD4, MSH3, POLD1 and POLE (sequencing only); EPCAM and GREM1 (deletion/duplication only).    01/14/2024 -  Chemotherapy   Patient is on Treatment Plan : PROSTATE Docetaxel (75) + Prednisone q14d         REVIEW OF SYSTEMS:   All relevant systems were reviewed with the patient and are negative.   VITALS:  Blood pressure (!) 145/53, pulse 70, temperature (!) 97.5 F (36.4 C), temperature source Temporal, resp. rate 17, weight 206 lb 11.2 oz (93.8 kg), SpO2 100%.  Wt Readings from Last 3 Encounters:  02/05/24 206 lb 11.2 oz (93.8 kg)  01/14/24 209 lb 1.6 oz (94.8 kg)  01/01/24 204 lb (92.5 kg)    Body mass index is 31.9 kg/m.  Performance status (ECOG): 1 - Symptomatic but completely ambulatory  PHYSICAL EXAM:   GENERAL: alert, no distress and comfortable SKIN: skin color normal EYES: normal, sclera clear LUNGS: clear to auscultation with normal breathing effort.  No wheeze or rales HEART: regular rate & rhythm ABDOMEN: abdomen soft, non-tender and nondistended Musculoskeletal: no edema  LABORATORY DATA:  I have reviewed the data as listed    Component Value Date/Time   NA 141 02/05/2024 1043   K 4.4 02/05/2024 1043   CL 107 02/05/2024 1043   CO2 28 02/05/2024 1043   GLUCOSE 91 02/05/2024 1043   BUN 35 (H) 02/05/2024 1043   CREATININE 1.45 (H) 02/05/2024 1043   CREATININE 1.17 11/02/2015 0914   CALCIUM 8.7 (L) 02/05/2024 1043   PROT 6.3 (L) 02/05/2024 1043   ALBUMIN 3.5 02/05/2024 1043   AST 14 (L) 02/05/2024 1043   ALT 8 02/05/2024 1043   ALKPHOS 57 02/05/2024 1043    BILITOT 0.3 02/05/2024 1043   GFRNONAA 48 (L) 02/05/2024 1043    No results found for: "SPEP", "UPEP"  Lab Results  Component Value Date   WBC 5.7 02/05/2024   NEUTROABS 4.6 02/05/2024   HGB 10.4 (L) 02/05/2024   HCT 31.1 (L) 02/05/2024   MCV 97.5 02/05/2024   PLT 243 02/05/2024      Chemistry      Component Value Date/Time   NA 141 02/05/2024 1043   K 4.4 02/05/2024 1043   CL 107 02/05/2024 1043   CO2 28 02/05/2024 1043   BUN 35 (H) 02/05/2024 1043   CREATININE 1.45 (H) 02/05/2024 1043   CREATININE 1.17 11/02/2015 0914      Component Value Date/Time   CALCIUM 8.7 (L) 02/05/2024 1043   ALKPHOS 57 02/05/2024 1043   AST 14 (L) 02/05/2024 1043   ALT 8 02/05/2024 1043   BILITOT 0.3 02/05/2024 1043       RADIOGRAPHIC STUDIES: I have personally reviewed the radiological images as listed and agreed with the findings in the report. No results found.

## 2024-02-05 ENCOUNTER — Inpatient Hospital Stay: Payer: Medicare Other

## 2024-02-05 ENCOUNTER — Telehealth: Payer: Self-pay

## 2024-02-05 VITALS — BP 145/53 | HR 70 | Temp 97.5°F | Resp 17 | Wt 206.7 lb

## 2024-02-05 DIAGNOSIS — Z9189 Other specified personal risk factors, not elsewhere classified: Secondary | ICD-10-CM

## 2024-02-05 DIAGNOSIS — C779 Secondary and unspecified malignant neoplasm of lymph node, unspecified: Secondary | ICD-10-CM | POA: Insufficient documentation

## 2024-02-05 DIAGNOSIS — C799 Secondary malignant neoplasm of unspecified site: Secondary | ICD-10-CM | POA: Diagnosis not present

## 2024-02-05 DIAGNOSIS — Z5111 Encounter for antineoplastic chemotherapy: Secondary | ICD-10-CM | POA: Insufficient documentation

## 2024-02-05 DIAGNOSIS — D510 Vitamin B12 deficiency anemia due to intrinsic factor deficiency: Secondary | ICD-10-CM | POA: Insufficient documentation

## 2024-02-05 DIAGNOSIS — C61 Malignant neoplasm of prostate: Secondary | ICD-10-CM | POA: Diagnosis not present

## 2024-02-05 DIAGNOSIS — N189 Chronic kidney disease, unspecified: Secondary | ICD-10-CM | POA: Diagnosis not present

## 2024-02-05 DIAGNOSIS — D518 Other vitamin B12 deficiency anemias: Secondary | ICD-10-CM | POA: Diagnosis not present

## 2024-02-05 DIAGNOSIS — Z79899 Other long term (current) drug therapy: Secondary | ICD-10-CM | POA: Diagnosis not present

## 2024-02-05 DIAGNOSIS — N1831 Chronic kidney disease, stage 3a: Secondary | ICD-10-CM

## 2024-02-05 DIAGNOSIS — I129 Hypertensive chronic kidney disease with stage 1 through stage 4 chronic kidney disease, or unspecified chronic kidney disease: Secondary | ICD-10-CM | POA: Insufficient documentation

## 2024-02-05 DIAGNOSIS — D539 Nutritional anemia, unspecified: Secondary | ICD-10-CM | POA: Diagnosis not present

## 2024-02-05 DIAGNOSIS — Z79633 Long term (current) use of mitotic inhibitor: Secondary | ICD-10-CM | POA: Diagnosis not present

## 2024-02-05 DIAGNOSIS — Z923 Personal history of irradiation: Secondary | ICD-10-CM | POA: Insufficient documentation

## 2024-02-05 DIAGNOSIS — E785 Hyperlipidemia, unspecified: Secondary | ICD-10-CM | POA: Insufficient documentation

## 2024-02-05 LAB — CMP (CANCER CENTER ONLY)
ALT: 8 U/L (ref 0–44)
AST: 14 U/L — ABNORMAL LOW (ref 15–41)
Albumin: 3.5 g/dL (ref 3.5–5.0)
Alkaline Phosphatase: 57 U/L (ref 38–126)
Anion gap: 6 (ref 5–15)
BUN: 35 mg/dL — ABNORMAL HIGH (ref 8–23)
CO2: 28 mmol/L (ref 22–32)
Calcium: 8.7 mg/dL — ABNORMAL LOW (ref 8.9–10.3)
Chloride: 107 mmol/L (ref 98–111)
Creatinine: 1.45 mg/dL — ABNORMAL HIGH (ref 0.61–1.24)
GFR, Estimated: 48 mL/min — ABNORMAL LOW (ref >60)
Glucose, Bld: 91 mg/dL (ref 70–99)
Potassium: 4.4 mmol/L (ref 3.5–5.1)
Sodium: 141 mmol/L (ref 135–145)
Total Bilirubin: 0.3 mg/dL (ref 0.0–1.2)
Total Protein: 6.3 g/dL — ABNORMAL LOW (ref 6.5–8.1)

## 2024-02-05 LAB — CBC WITH DIFFERENTIAL (CANCER CENTER ONLY)
Abs Immature Granulocytes: 0.03 10*3/uL (ref 0.00–0.07)
Basophils Absolute: 0 10*3/uL (ref 0.0–0.1)
Basophils Relative: 0 %
Eosinophils Absolute: 0 10*3/uL (ref 0.0–0.5)
Eosinophils Relative: 0 %
HCT: 31.1 % — ABNORMAL LOW (ref 39.0–52.0)
Hemoglobin: 10.4 g/dL — ABNORMAL LOW (ref 13.0–17.0)
Immature Granulocytes: 1 %
Lymphocytes Relative: 11 %
Lymphs Abs: 0.6 10*3/uL — ABNORMAL LOW (ref 0.7–4.0)
MCH: 32.6 pg (ref 26.0–34.0)
MCHC: 33.4 g/dL (ref 30.0–36.0)
MCV: 97.5 fL (ref 80.0–100.0)
Monocytes Absolute: 0.4 10*3/uL (ref 0.1–1.0)
Monocytes Relative: 7 %
Neutro Abs: 4.6 10*3/uL (ref 1.7–7.7)
Neutrophils Relative %: 81 %
Platelet Count: 243 10*3/uL (ref 150–400)
RBC: 3.19 MIL/uL — ABNORMAL LOW (ref 4.22–5.81)
RDW: 14.8 % (ref 11.5–15.5)
WBC Count: 5.7 10*3/uL (ref 4.0–10.5)
nRBC: 0 % (ref 0.0–0.2)

## 2024-02-05 MED ORDER — SODIUM CHLORIDE 0.9 % IV SOLN
50.0000 mg/m2 | Freq: Once | INTRAVENOUS | Status: AC
  Administered 2024-02-05: 106 mg via INTRAVENOUS
  Filled 2024-02-05: qty 10.6

## 2024-02-05 MED ORDER — SODIUM CHLORIDE 0.9% FLUSH
10.0000 mL | INTRAVENOUS | Status: DC | PRN
  Administered 2024-02-05: 10 mL

## 2024-02-05 MED ORDER — SODIUM CHLORIDE 0.9 % IV SOLN
INTRAVENOUS | Status: DC
Start: 2024-02-05 — End: 2024-02-05

## 2024-02-05 MED ORDER — DEXAMETHASONE SODIUM PHOSPHATE 10 MG/ML IJ SOLN
10.0000 mg | Freq: Once | INTRAMUSCULAR | Status: AC
  Administered 2024-02-05: 10 mg via INTRAVENOUS
  Filled 2024-02-05: qty 1

## 2024-02-05 MED ORDER — HEPARIN SOD (PORK) LOCK FLUSH 100 UNIT/ML IV SOLN
500.0000 [IU] | Freq: Once | INTRAVENOUS | Status: AC | PRN
  Administered 2024-02-05: 500 [IU]

## 2024-02-05 MED ORDER — SODIUM CHLORIDE 0.9% FLUSH
10.0000 mL | Freq: Once | INTRAVENOUS | Status: AC
  Administered 2024-02-05: 10 mL

## 2024-02-05 NOTE — Telephone Encounter (Signed)
 Marland Kitchen

## 2024-02-05 NOTE — Patient Instructions (Signed)
 [facility] - A DEPT OF [facility]  Discharge Instructions: Thank you for choosing [facility] to provide your oncology and hematology care.   If you have a lab appointment with the Cancer Center, please go directly to the Cancer Center and check in at the registration area.   Wear comfortable clothing and clothing appropriate for easy access to any Portacath or PICC line.   We strive to give you quality time with your provider. You may need to reschedule your appointment if you arrive late (15 or more minutes).  Arriving late affects you and other patients whose appointments are after yours.  Also, if you miss three or more appointments without notifying the office, you may be dismissed from the clinic at the provider's discretion.      For prescription refill requests, have your pharmacy contact our office and allow 72 hours for refills to be completed.    Today you received the following chemotherapy and/or immunotherapy agents: Docetaxel      To help prevent nausea and vomiting after your treatment, we encourage you to take your nausea medication as directed.  BELOW ARE SYMPTOMS THAT SHOULD BE REPORTED IMMEDIATELY: *FEVER GREATER THAN 100.4 F (38 C) OR HIGHER *CHILLS OR SWEATING *NAUSEA AND VOMITING THAT IS NOT CONTROLLED WITH YOUR NAUSEA MEDICATION *UNUSUAL SHORTNESS OF BREATH *UNUSUAL BRUISING OR BLEEDING *URINARY PROBLEMS (pain or burning when urinating, or frequent urination) *BOWEL PROBLEMS (unusual diarrhea, constipation, pain near the anus) TENDERNESS IN MOUTH AND THROAT WITH OR WITHOUT PRESENCE OF ULCERS (sore throat, sores in mouth, or a toothache) UNUSUAL RASH, SWELLING OR PAIN  UNUSUAL VAGINAL DISCHARGE OR ITCHING   Items with * indicate a potential emergency and should be followed up as soon as possible or go to the Emergency Department if any problems should occur.  Please show the CHEMOTHERAPY ALERT CARD or IMMUNOTHERAPY  ALERT CARD at check-in to the Emergency Department and triage nurse.  Should you have questions after your visit or need to cancel or reschedule your appointment, please contact [facility] - A DEPT OF [facility]  Dept: [phone]  and follow the prompts.  Office hours are 8:00 a.m. to 4:30 p.m. Monday - Friday. Please note that voicemails left after 4:00 p.m. may not be returned until the following business day.  We are closed weekends and major holidays. You have access to a nurse at all times for urgent questions. Please call the main number to the clinic Dept: [phone] and follow the prompts.   For any non-urgent questions, you may also contact your provider using MyChart. We now offer e-Visits for anyone 58 and older to request care online for non-urgent symptoms. For details visit [email or web address].   Also download the MyChart app! Go to the app store, search "MyChart", open the app, select , and log in with your MyChart username and password.`

## 2024-02-06 ENCOUNTER — Other Ambulatory Visit: Payer: Self-pay

## 2024-02-06 LAB — PROSTATE-SPECIFIC AG, SERUM (LABCORP): Prostate Specific Ag, Serum: 200 ng/mL — ABNORMAL HIGH (ref 0.0–4.0)

## 2024-02-06 LAB — TESTOSTERONE: Testosterone: <3 ng/dL — ABNORMAL LOW (ref 264–916)

## 2024-02-07 ENCOUNTER — Other Ambulatory Visit: Payer: Self-pay

## 2024-02-07 DIAGNOSIS — C61 Malignant neoplasm of prostate: Secondary | ICD-10-CM | POA: Diagnosis not present

## 2024-02-07 DIAGNOSIS — C778 Secondary and unspecified malignant neoplasm of lymph nodes of multiple regions: Secondary | ICD-10-CM | POA: Diagnosis not present

## 2024-02-07 DIAGNOSIS — C799 Secondary malignant neoplasm of unspecified site: Secondary | ICD-10-CM

## 2024-02-07 DIAGNOSIS — N393 Stress incontinence (female) (male): Secondary | ICD-10-CM | POA: Diagnosis not present

## 2024-02-07 DIAGNOSIS — N390 Urinary tract infection, site not specified: Secondary | ICD-10-CM | POA: Diagnosis not present

## 2024-02-07 NOTE — Progress Notes (Signed)
 Spoke to patient.  PSA increased a lot and not just a little bit despite on active treatment with docetaxel.  Given PSMA active lesions of metastatic prostate cancer, recommend referral for evaluation of Pluvicto.  He agrees with the plan.  Referral placed.  Keep appt with me and lab and reassess in 2 weeks.

## 2024-02-12 ENCOUNTER — Other Ambulatory Visit: Payer: Self-pay

## 2024-02-12 ENCOUNTER — Encounter (HOSPITAL_COMMUNITY)
Admission: RE | Admit: 2024-02-12 | Discharge: 2024-02-12 | Disposition: A | Discharge status: Home / Self Care | Source: Ambulatory Visit

## 2024-02-12 DIAGNOSIS — C799 Secondary malignant neoplasm of unspecified site: Secondary | ICD-10-CM

## 2024-02-12 DIAGNOSIS — R9721 Rising PSA following treatment for malignant neoplasm of prostate: Secondary | ICD-10-CM | POA: Diagnosis not present

## 2024-02-12 DIAGNOSIS — C61 Malignant neoplasm of prostate: Secondary | ICD-10-CM | POA: Diagnosis not present

## 2024-02-12 NOTE — Consult Note (Signed)
 Chief Complaint: Ian Dudley resistant metastatic prostate carcinoma.]Evaluation for Lu-177 PSMA therapy.  Referring Physician(s):Chang    Patient Status: [facility] - Out-pt  History of Present Illness: Ian Dudley is a 83 y.o. male mCRPA   Initial diagnosis: 10/15/2007.  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 Extensive perineural invasion. PSA in 12/2007 was up to 21.  Progression: PSA 0.1 in 12/2008 and 0.2 in 07/2009.  Germline Ambry testing negative. VUS RAD51D Somatic testing: TMB low. pMMR/MSI stable. PDL1 negative. HRD negative. Previous treatment: prostatectomy, salvage radiation, ADT/ARPI Current treatment: docetaxel.   ADT at [facility] every 6 months.  Rising PSA equal 200.    Multifocal PSMA avid prostate cancer metastasis on recent F 18 PSMA PET scan.      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    Insect bite    Localized swelling, mass and lump, unspecified lower limb    Myalgia    Osteoarthritis    Other forms of dyspnea    Other long term (current) drug therapy    Pain in right knee    Personal history of malignant neoplasm of prostate    Prostate cancer (HCC)    Pure hypercholesterolemia    Secondary cataract     Past Surgical History:  Procedure Laterality Date   HUMERUS IM NAIL Left 11/15/2022   Procedure: INTRAMEDULLARY (IM) NAIL HUMERAL;  Surgeon: Bjorn Pippin, MD;  Location: [facility];  Service: Orthopedics;  Laterality: Left;   IR IMAGING GUIDED PORT INSERTION  01/01/2024   PROSTATE BIOPSY      PROSTATECTOMY     TONSILLECTOMY      Allergies: Patient has no known allergies.  Medications: Prior to Admission medications   Medication Sig Start Date End Date Taking? Authorizing Provider  acetaminophen (TYLENOL) 500 MG tablet Take 500 mg by mouth every 6 (six) hours as needed.    [provider]  ascorbic acid (VITAMIN C) 500 MG tablet Take 500 mg by mouth once a week.    [provider]  cyanocobalamin (VITAMIN B12) 1000 MCG tablet Take 1,000 mcg by mouth daily.    [provider]  dexamethasone (DECADRON) 4 MG tablet Take 2 tabs by mouth 2 times daily starting day before chemo. Then take 2 tabs daily for 2 days starting day after chemo. Take with food. 12/18/23   Melven Sartorius, MD  glipiZIDE (GLUCOTROL XL) 10 MG 24 hr tablet Take 10 mg by mouth 4 (four) times a week. 10/14/22   [provider]  lidocaine-prilocaine (EMLA) cream Apply to affected area once 12/18/23   Melven Sartorius, MD  lisinopril-hydrochlorothiazide (PRINZIDE,ZESTORETIC) 10-12.5 MG tablet Take 1 tablet by mouth daily. THIS IS THE LAST AUTHORIZED REFILL FOR THIS MEDICATION.  Please have future refills authorized by PCP 03/07/17   Lyn Records, MD  metFORMIN (GLUCOPHAGE-XR) 500 MG 24 hr tablet Take 500 mg by mouth daily. 05/18/23   [provider]  ondansetron (ZOFRAN) 8 MG tablet Take  1 tablet (8 mg total) by mouth every 8 (eight) hours as needed for nausea or vomiting. 12/18/23   Melven Sartorius, MD  pioglitazone (ACTOS) 30 MG tablet Take 30 mg by mouth daily.    [provider]  predniSONE (DELTASONE) 5 MG tablet Take 1 tablet (5 mg total) by mouth in the morning and at bedtime. 12/18/23   Melven Sartorius, MD  prochlorperazine (COMPAZINE) 10 MG tablet Take 1 tablet (10 mg total) by mouth every 6 (six) hours as needed for nausea or vomiting. 12/18/23   Melven Sartorius, MD  simvastatin (ZOCOR) 10 MG tablet Take 10 mg by mouth daily.    [provider]      Family History  Problem Relation Age of Onset   Hypertension Mother    Diabetes Mother    Kidney failure Mother    Breast cancer Sister    Lupus Sister    Colon cancer Brother    Prostate cancer Brother    Colon cancer Sister    Healthy Sister    Healthy Brother    Healthy Brother     Social History   Socioeconomic History   Marital status: Married    Spouse name: Not on file   Number of children: Not on file   Years of education: Not on file   Highest education level: Not on file  Occupational History   Not on file  Tobacco Use   Smoking status: Former    Types: Cigarettes    Passive exposure: Never   Smokeless tobacco: Never  Vaping Use   Vaping status: Never Used  Substance and Sexual Activity   Alcohol use: No    Alcohol/week: 0.0 standard drinks of alcohol   Drug use: No   Sexual activity: Not on file  Other Topics Concern   Not on file  Social History Narrative   Not on file   Social Drivers of Health   Financial Resource Strain: Not on file  Food Insecurity: No Food Insecurity (12/18/2023)   Hunger Vital Sign    Worried About Running Out of Food in the Last Year: Never true    Ran Out of Food in the Last Year: Never true  Transportation Needs: No Transportation Needs (12/18/2023)   PRAPARE - Administrator, Civil Service (Medical): No    Lack of Transportation (Non-Medical): No  Physical Activity: Not on file  Stress: Not on file  Social Connections: Unknown (04/18/2022)   Received from [facility]   Social Network    Social Network: Not on file    ECOG Status: 0 - Asymptomatic  Review of Systems:  Patient accompanied by daughter  Review of Systems: No bone paine. Well appearing.  Active in community  Vital Signs: There were no vitals taken for this visit.  Physical Exam:deferred  Imaging: No results found.  1. Intensely radiotracer avid large LEFT supraclavicular, mediastinal, and retroperitoneal lymph nodes are mildly  increased in size and similar to mildly increased in intense radiotracer activity. Findings consistent with mild progression metastatic adenopathy. 2. No new sites of metastatic adenopathy. 3. 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 4. No liver metastasis. No lung metastasis   Labs:  CBC: Recent Labs    08/15/23 0811 12/18/23 1202 01/14/24 0842 02/05/24 1043  WBC 4.1 3.2* 2.8* 5.7  HGB 10.9* 11.1* 10.4* 10.4*  HCT 34.3* 33.9* 31.6* 31.1*  PLT 240 226 214 243    COAGS:  Recent Labs    08/15/23 0811  INR 1.0    BMP: Recent Labs    12/18/23 1202 01/14/24 0842 02/05/24 1043  NA 140 140 141  K 4.8 4.0 4.4  CL 108 108 107  CO2 29 25 28   GLUCOSE 97 139* 91  BUN 35* 35* 35*  CALCIUM 9.4 8.7* 8.7*  CREATININE 1.42* 1.42* 1.45*  GFRNONAA 49* 49* 48*    LIVER FUNCTION TESTS: Recent Labs    12/18/23 1202 01/14/24 0842 02/05/24 1043  BILITOT 0.4 0.4 0.3  AST 17 16 14*  ALT 7 8 8   ALKPHOS 59 65 57  PROT 6.6 6.4* 6.3*  ALBUMIN 3.7 3.6 3.5    TUMOR MARKERS: PSA = 200 .Marland Kitchen Recent increase  Assessment and Plan:  [Patient is adequate candidate Lu 177 PSMA therapy ( vipivotide tetraxetan).  Patient demonstrates moderate progression metastatic [lymphadenopathy and skeletal disease] identified on recent PSMA PET scan.  Additionally patient's PSA is gradually increasing.  Patient has demonstrated progression on androgen deprivation and taxane chemotherapy.   Patient explained major and minor risks and benefits of therapy.  Major benefit being progression-free survival.  Major risk being myelosuppression and renal toxicity. Minor toxicity of xerostomia.  All the patient's questions were answered.  Patient accompanied by daughter who was also present for consult.    Patient is scheduled for 6 treatments spaced 6 weeks apart.  Recommend following up with oncologist for CBC and CMP 1 week prior to each treatment to assess safety of  continuing with therapy.      Thank you for this interesting consult.  I greatly enjoyed meeting Ian Dudley and look forward to participating in their care.  A copy of this report was sent to the requesting provider on this date.  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

## 2024-02-13 ENCOUNTER — Other Ambulatory Visit: Payer: Self-pay

## 2024-02-17 NOTE — Assessment & Plan Note (Signed)
 Will start Pluvicto on 3/25 Stop docetaxel Follow up one week before each Pluvicto for CBC, T&S and transfusion if needed.

## 2024-02-17 NOTE — Progress Notes (Unsigned)
 Patient Care Team: Corine Shelter, MD as PCP - General (Pulmonary Disease) Lyn Records, MD (Inactive) as PCP - Cardiology (Cardiology) Cherlyn Cushing, RN as Oncology Nurse Navigator  Clinic Day:  02/18/2024  Referring physician: Corine Shelter, MD  ASSESSMENT & PLAN:   Assessment & Plan: Ian Dudley is a 83 y.o.male with history of HTN, HLD, being follow up at [facility] for recurrent prostate cancer. 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 He had rising PSA and enrolled into [facility] in 2021. PSMA PET in September 2024 showed lymphadenopathy above and below the diaphragm. Repeat staging in 12/2023 showed progression metastatic adenopathy. He started docetaxel on 01/14/24 at reduced dose due to his age.   Negative genetic testing on the CancerNext+RNAinsight panel. RAD51D p.R165W (c.493C>T) VUS identified.    Current diagnosis: mCRPC Initial diagnosis: 10/15/2007.  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 Extensive perineural invasion. PSA in 12/2007 was up to 21.  Progression: PSA 0.1 in 12/2008 and 0.2 in 07/2009.  Germline Ambry testing negative. VUS RAD51D Somatic testing: TMB low. pMMR/MSI stable. PDL1 negative. HRD negative. Previous treatment: prostatectomy, salvage radiation, ADT/ARPI  Most recent treatment: docetaxel. 02/05/24 completed cycle 2 with cytopenia.  Progression of disease with risking PSA. Recommend Pluvicto.   ADT at [facility] every 6 months.  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  B12 deficiency anemia B12 1000 mcg daily  Leukopenia Partly from low b12 and recent  chemotherapy Will continue b12 daily  CKD (chronic kidney disease) Recommend him to increase hydration 60-70 oz per day Monitor with lab every 6 weeks before Pluvicto   Will cancel chemotherapy appointment. Keep appt on 4/28 with lab, me and transfusion of red cell if needed. Follow up with Pluvicto.  The patient understands the plans discussed today and is in agreement with them.  He knows to contact our office if he develops concerns prior to his next appointment.  Melven Sartorius, MD  [facility] - A DEPT OF [facility] [address] [place] [address] Dept: [phone] Dept Fax: [phone]   Orders Placed This Encounter  Procedures   CBC with Differential (Cancer Center Only)    Standing Status:   Future    Expiration Date:   02/17/2025   CMP (Cancer Center only)    Standing Status:   Future    Expiration Date:   02/17/2025   Prostate-Specific AG, Serum    Standing Status:   Future    Expiration Date:   02/17/2025   Testosterone    Standing Status:   Future    Expiration Date:   02/17/2025   Sample to Blood Bank    Standing Status:   Future    Expected Date:   03/31/2024    Expiration Date:   02/17/2025      CHIEF COMPLAINT:  CC: mCRPC  Current Treatment:  changing to Pluvicto  INTERVAL HISTORY:  Ian Dudley is here today with [place]. He is taking b12 1000 mcg daily for about 3-4 weeks.  He denies fevers or chills. No signs of infection. He denies new bone or back pain. His appetite  is good. Walking fine.   I have reviewed the past medical history, past surgical history, social history and family history with the patient and they are unchanged from previous note.  ALLERGIES:  has no known allergies.  MEDICATIONS:  Current Outpatient Medications  Medication Sig Dispense Refill   acetaminophen (TYLENOL) 500 MG tablet Take 500 mg by mouth every 6 (six) hours as needed.     ascorbic acid (VITAMIN C)  500 MG tablet Take 500 mg by mouth once a week.     cyanocobalamin (VITAMIN B12) 1000 MCG tablet Take 1,000 mcg by mouth daily.     glipiZIDE (GLUCOTROL XL) 10 MG 24 hr tablet Take 10 mg by mouth 4 (four) times a week.     lisinopril-hydrochlorothiazide (PRINZIDE,ZESTORETIC) 10-12.5 MG tablet Take 1 tablet by mouth daily. THIS IS THE LAST AUTHORIZED REFILL FOR THIS MEDICATION.  Please have future refills authorized by PCP 30 tablet 0   metFORMIN (GLUCOPHAGE-XR) 500 MG 24 hr tablet Take 500 mg by mouth daily.     pioglitazone (ACTOS) 30 MG tablet Take 30 mg by mouth daily.     simvastatin (ZOCOR) 10 MG tablet Take 10 mg by mouth daily.     No current facility-administered medications for this visit.   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 Extensive perineural invasion.   12/2008 Tumor Marker   PSA 0.1   07/2009 Tumor Marker   PSA 0.2   11/2021 Tumor Marker   PSA 4.26   03/2022 Tumor Marker   PSA 12.2   03/2022 -  Chemotherapy   Started Diana Eves Report he could not take continuously due to cost.   12/2022 Tumor Marker   PSA 26   03/2023 Tumor Marker   PSA 33   07/2023 Tumor Marker   PSA 73.3   07/23/2023 Imaging   CT AP new pathologic retrocrural and para-aortic adenopathy, with enlarging lower thoracic para-aortic adenopathy; no definite pathologic adenopathy in pelvis.       08/15/2023 Procedure   A. LYMPH NODE, LEFT SIDED RETROPERITONEAL NODAL MASS, NEEDLE CORE BIOPSY:      Metastatic carcinoma, consistent with prostate primary.       See comment.   COMMENT:   Immunohistochemical stains for NKX3.1 and prostein were performed and  are positive in the tumor cells. The findings are supportive of  metastatic carcinoma with prostate primary.   Controls worked appropriately.    08/21/2023 PET scan   PSMA PET IMPRESSION: Prior prostatectomy. No evidence of local tumor recurrence.   Metastatic lymphadenopathy in the left lower neck, mediastinum, bilateral retrocrural regions, and abdominal retroperitoneum in left para-aortic region.   No evidence of pelvic metastatic lymphadenopathy or skeletal metastases.   Emphysema (ICD10-J43.9).   08/2023 Miscellaneous   Consultation with radiation oncology.  PSMA PET performed.  Radiation treatment is not a suitable option due to the extent  of the disease.    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  RAD51D  p.R165W (c.493C>T) VUS identified.  The report date is January 01, 2024.  The [facility] CancerNext+RNAinsight Panel includes sequencing, rearrangement analysis, and RNA analysis for the following 39 genes: APC, ATM, BAP1, BARD1, BMPR1A, BRCA1, BRCA2, BRIP1, CDH1, CDKN2A, CHEK2, FH, FLCN, MET, MLH1, MSH2, MSH6, MUTYH, NF1, NTHL1, PALB2, PMS2, PTEN, RAD51C, RAD51D, SMAD4, STK11, TP53, TSC1, TSC2, and VHL (sequencing and deletion/duplication); AXIN2, HOXB13, MBD4, MSH3, POLD1 and POLE (sequencing only); EPCAM and GREM1 (deletion/duplication only).    01/14/2024 - 02/05/2024 Chemotherapy   Patient is on Treatment Plan : PROSTATE Docetaxel (75) + Prednisone q14d         REVIEW OF SYSTEMS:   All relevant systems  were reviewed with the patient and are negative.   VITALS:  Blood pressure (!) 131/43, pulse 73, temperature (!) 97.3 F (36.3 C), temperature source Temporal, resp. rate 16, weight 203 lb 1.6 oz (92.1 kg), SpO2 100%.  Wt Readings from Last 3 Encounters:  02/18/24 203 lb 1.6 oz (92.1 kg)  02/05/24 206 lb 11.2 oz (93.8 kg)  01/14/24 209 lb 1.6 oz (94.8 kg)    Body mass index is 31.34 kg/m.  Performance status (ECOG): 1 - Symptomatic but completely ambulatory  PHYSICAL EXAM:   GENERAL:alert, no distress and comfortable NECK: supple, no erythema around Mediport site   LABORATORY DATA:  I have reviewed the data as listed    Component Value Date/Time   NA 139 02/18/2024 1048   K 4.6 02/18/2024 1048   CL 105 02/18/2024 1048   CO2 26 02/18/2024 1048   GLUCOSE 153 (H) 02/18/2024 1048   BUN 47 (H) 02/18/2024 1048   CREATININE 1.77 (H) 02/18/2024 1048   CREATININE 1.17 11/02/2015 0914   CALCIUM 8.7 (L) 02/18/2024 1048   PROT 6.5 02/18/2024 1048   ALBUMIN 3.4 (L) 02/18/2024 1048   AST 16 02/18/2024 1048   ALT 10 02/18/2024 1048   ALKPHOS 59 02/18/2024 1048   BILITOT 0.3 02/18/2024 1048   GFRNONAA 38 (L) 02/18/2024 1048    No results found for: "SPEP", "UPEP"  Lab Results  Component Value Date   WBC 1.9 (L) 02/18/2024   NEUTROABS 1.5 (L) 02/18/2024   HGB 9.9 (L) 02/18/2024   HCT 30.2 (L) 02/18/2024   MCV 94.7 02/18/2024   PLT 230 02/18/2024      Chemistry      Component Value Date/Time   NA 139 02/18/2024 1048   K 4.6 02/18/2024 1048   CL 105 02/18/2024 1048   CO2 26 02/18/2024 1048   BUN 47 (H) 02/18/2024 1048   CREATININE 1.77 (H) 02/18/2024 1048   CREATININE 1.17 11/02/2015 0914      Component Value Date/Time   CALCIUM 8.7 (L) 02/18/2024 1048   ALKPHOS 59 02/18/2024 1048   AST 16 02/18/2024 1048   ALT 10 02/18/2024 1048   BILITOT 0.3 02/18/2024 1048       RADIOGRAPHIC STUDIES: I have personally reviewed the radiological images as listed and agreed  with the findings in the report. NM Radiologist Eval And Mgmt Result Date: 02/12/2024 EXAM: NEW PATIENT OFFICE VISIT CHIEF COMPLAINT: Castrate resistant metastatic prostate carcinoma. Current Pain Level: 1-10 HISTORY OF PRESENT ILLNESS: Rising PSA equal 200. Multifocal  PSMA avid prostate cancer metastasis on recent F 18 PSMA PET scan. REVIEW OF SYSTEMS: See epic note PHYSICAL EXAMINATION: See epic note ASSESSMENT AND PLAN: See epic note Electronically Signed   By: Genevive Bi M.D.   On: 02/12/2024 13:06

## 2024-02-18 ENCOUNTER — Inpatient Hospital Stay: Payer: Medicare Other

## 2024-02-18 VITALS — BP 131/43 | HR 73 | Temp 97.3°F | Resp 16 | Wt 203.1 lb

## 2024-02-18 DIAGNOSIS — Z923 Personal history of irradiation: Secondary | ICD-10-CM | POA: Diagnosis not present

## 2024-02-18 DIAGNOSIS — Z79633 Long term (current) use of mitotic inhibitor: Secondary | ICD-10-CM | POA: Diagnosis not present

## 2024-02-18 DIAGNOSIS — D539 Nutritional anemia, unspecified: Secondary | ICD-10-CM | POA: Diagnosis not present

## 2024-02-18 DIAGNOSIS — C799 Secondary malignant neoplasm of unspecified site: Secondary | ICD-10-CM

## 2024-02-18 DIAGNOSIS — I129 Hypertensive chronic kidney disease with stage 1 through stage 4 chronic kidney disease, or unspecified chronic kidney disease: Secondary | ICD-10-CM | POA: Diagnosis not present

## 2024-02-18 DIAGNOSIS — D518 Other vitamin B12 deficiency anemias: Secondary | ICD-10-CM

## 2024-02-18 DIAGNOSIS — D709 Neutropenia, unspecified: Secondary | ICD-10-CM

## 2024-02-18 DIAGNOSIS — Z79899 Other long term (current) drug therapy: Secondary | ICD-10-CM | POA: Diagnosis not present

## 2024-02-18 DIAGNOSIS — N1832 Chronic kidney disease, stage 3b: Secondary | ICD-10-CM

## 2024-02-18 DIAGNOSIS — Z5111 Encounter for antineoplastic chemotherapy: Secondary | ICD-10-CM | POA: Diagnosis not present

## 2024-02-18 DIAGNOSIS — D510 Vitamin B12 deficiency anemia due to intrinsic factor deficiency: Secondary | ICD-10-CM | POA: Diagnosis not present

## 2024-02-18 DIAGNOSIS — E785 Hyperlipidemia, unspecified: Secondary | ICD-10-CM | POA: Diagnosis not present

## 2024-02-18 DIAGNOSIS — C61 Malignant neoplasm of prostate: Secondary | ICD-10-CM | POA: Diagnosis not present

## 2024-02-18 DIAGNOSIS — C779 Secondary and unspecified malignant neoplasm of lymph node, unspecified: Secondary | ICD-10-CM | POA: Diagnosis not present

## 2024-02-18 DIAGNOSIS — N189 Chronic kidney disease, unspecified: Secondary | ICD-10-CM | POA: Diagnosis not present

## 2024-02-18 DIAGNOSIS — D72819 Decreased white blood cell count, unspecified: Secondary | ICD-10-CM | POA: Insufficient documentation

## 2024-02-18 DIAGNOSIS — D72810 Lymphocytopenia: Secondary | ICD-10-CM | POA: Insufficient documentation

## 2024-02-18 LAB — CBC WITH DIFFERENTIAL (CANCER CENTER ONLY)
Abs Immature Granulocytes: 0.02 10*3/uL (ref 0.00–0.07)
Basophils Absolute: 0 10*3/uL (ref 0.0–0.1)
Basophils Relative: 1 %
Eosinophils Absolute: 0 10*3/uL (ref 0.0–0.5)
Eosinophils Relative: 0 %
HCT: 30.2 % — ABNORMAL LOW (ref 39.0–52.0)
Hemoglobin: 9.9 g/dL — ABNORMAL LOW (ref 13.0–17.0)
Immature Granulocytes: 1 %
Lymphocytes Relative: 14 %
Lymphs Abs: 0.3 10*3/uL — ABNORMAL LOW (ref 0.7–4.0)
MCH: 31 pg (ref 26.0–34.0)
MCHC: 32.8 g/dL (ref 30.0–36.0)
MCV: 94.7 fL (ref 80.0–100.0)
Monocytes Absolute: 0.1 10*3/uL (ref 0.1–1.0)
Monocytes Relative: 7 %
Neutro Abs: 1.5 10*3/uL — ABNORMAL LOW (ref 1.7–7.7)
Neutrophils Relative %: 77 %
Platelet Count: 230 10*3/uL (ref 150–400)
RBC: 3.19 MIL/uL — ABNORMAL LOW (ref 4.22–5.81)
RDW: 14.5 % (ref 11.5–15.5)
WBC Count: 1.9 10*3/uL — ABNORMAL LOW (ref 4.0–10.5)
nRBC: 1 % — ABNORMAL HIGH (ref 0.0–0.2)

## 2024-02-18 LAB — CMP (CANCER CENTER ONLY)
ALT: 10 U/L (ref 0–44)
AST: 16 U/L (ref 15–41)
Albumin: 3.4 g/dL — ABNORMAL LOW (ref 3.5–5.0)
Alkaline Phosphatase: 59 U/L (ref 38–126)
Anion gap: 8 (ref 5–15)
BUN: 47 mg/dL — ABNORMAL HIGH (ref 8–23)
CO2: 26 mmol/L (ref 22–32)
Calcium: 8.7 mg/dL — ABNORMAL LOW (ref 8.9–10.3)
Chloride: 105 mmol/L (ref 98–111)
Creatinine: 1.77 mg/dL — ABNORMAL HIGH (ref 0.61–1.24)
GFR, Estimated: 38 mL/min — ABNORMAL LOW (ref >60)
Glucose, Bld: 153 mg/dL — ABNORMAL HIGH (ref 70–99)
Potassium: 4.6 mmol/L (ref 3.5–5.1)
Sodium: 139 mmol/L (ref 135–145)
Total Bilirubin: 0.3 mg/dL (ref 0.0–1.2)
Total Protein: 6.5 g/dL (ref 6.5–8.1)

## 2024-02-18 LAB — VITAMIN B12: Vitamin B-12: 330 pg/mL (ref 180–914)

## 2024-02-18 MED ORDER — HEPARIN SOD (PORK) LOCK FLUSH 100 UNIT/ML IV SOLN
500.0000 [IU] | Freq: Once | INTRAVENOUS | Status: AC
  Administered 2024-02-18: 500 [IU]

## 2024-02-18 MED ORDER — SODIUM CHLORIDE 0.9% FLUSH
10.0000 mL | Freq: Once | INTRAVENOUS | Status: AC
  Administered 2024-02-18: 10 mL

## 2024-02-18 MED ORDER — SODIUM CHLORIDE 0.9% FLUSH
10.0000 mL | Freq: Once | INTRAVENOUS | Status: AC
Start: 2024-02-18 — End: 2024-02-18
  Administered 2024-02-18: 10 mL

## 2024-02-18 NOTE — Addendum Note (Signed)
 Addended by: Orland Mustard C on: 02/18/2024 11:45 AM   Modules accepted: Orders

## 2024-02-18 NOTE — Assessment & Plan Note (Signed)
 Recommend him to increase hydration 60-70 oz per day Monitor with lab every 6 weeks before Pluvicto

## 2024-02-18 NOTE — Assessment & Plan Note (Signed)
 B12 1000 mcg daily

## 2024-02-18 NOTE — Assessment & Plan Note (Signed)
 Partly from low b12 and recent chemotherapy Will continue b12 daily

## 2024-02-19 LAB — PROSTATE-SPECIFIC AG, SERUM (LABCORP): Prostate Specific Ag, Serum: 240 ng/mL — ABNORMAL HIGH (ref 0.0–4.0)

## 2024-02-19 LAB — TESTOSTERONE: Testosterone: <3 ng/dL — ABNORMAL LOW (ref 264–916)

## 2024-02-21 ENCOUNTER — Other Ambulatory Visit (HOSPITAL_COMMUNITY)

## 2024-02-26 ENCOUNTER — Encounter (HOSPITAL_COMMUNITY)
Admission: RE | Admit: 2024-02-26 | Discharge: 2024-02-26 | Disposition: A | Discharge status: Home / Self Care | Source: Ambulatory Visit

## 2024-02-26 VITALS — BP 135/53 | HR 88 | Resp 14

## 2024-02-26 DIAGNOSIS — N289 Disorder of kidney and ureter, unspecified: Secondary | ICD-10-CM | POA: Diagnosis not present

## 2024-02-26 DIAGNOSIS — C61 Malignant neoplasm of prostate: Secondary | ICD-10-CM | POA: Diagnosis not present

## 2024-02-26 DIAGNOSIS — C771 Secondary and unspecified malignant neoplasm of intrathoracic lymph nodes: Secondary | ICD-10-CM | POA: Diagnosis not present

## 2024-02-26 DIAGNOSIS — D649 Anemia, unspecified: Secondary | ICD-10-CM | POA: Diagnosis not present

## 2024-02-26 DIAGNOSIS — C779 Secondary and unspecified malignant neoplasm of lymph node, unspecified: Secondary | ICD-10-CM | POA: Diagnosis not present

## 2024-02-26 DIAGNOSIS — C799 Secondary malignant neoplasm of unspecified site: Secondary | ICD-10-CM | POA: Insufficient documentation

## 2024-02-26 LAB — COMPREHENSIVE METABOLIC PANEL
ALT: 14 U/L (ref 0–44)
AST: 21 U/L (ref 15–41)
Albumin: 2.7 g/dL — ABNORMAL LOW (ref 3.5–5.0)
Alkaline Phosphatase: 49 U/L (ref 38–126)
Anion gap: 9 (ref 5–15)
BUN: 42 mg/dL — ABNORMAL HIGH (ref 8–23)
CO2: 26 mmol/L (ref 22–32)
Calcium: 8.8 mg/dL — ABNORMAL LOW (ref 8.9–10.3)
Chloride: 102 mmol/L (ref 98–111)
Creatinine, Ser: 1.67 mg/dL — ABNORMAL HIGH (ref 0.61–1.24)
GFR, Estimated: 40 mL/min — ABNORMAL LOW (ref >60)
Glucose, Bld: 111 mg/dL — ABNORMAL HIGH (ref 70–99)
Potassium: 5 mmol/L (ref 3.5–5.1)
Sodium: 137 mmol/L (ref 135–145)
Total Bilirubin: 0.5 mg/dL (ref 0.0–1.2)
Total Protein: 6.3 g/dL — ABNORMAL LOW (ref 6.5–8.1)

## 2024-02-26 LAB — CBC WITH DIFFERENTIAL/PLATELET
Abs Immature Granulocytes: 0.04 10*3/uL (ref 0.00–0.07)
Basophils Absolute: 0 10*3/uL (ref 0.0–0.1)
Basophils Relative: 0 %
Eosinophils Absolute: 0 10*3/uL (ref 0.0–0.5)
Eosinophils Relative: 1 %
HCT: 33.7 % — ABNORMAL LOW (ref 39.0–52.0)
Hemoglobin: 10.5 g/dL — ABNORMAL LOW (ref 13.0–17.0)
Immature Granulocytes: 1 %
Lymphocytes Relative: 9 %
Lymphs Abs: 0.5 10*3/uL — ABNORMAL LOW (ref 0.7–4.0)
MCH: 31.3 pg (ref 26.0–34.0)
MCHC: 31.2 g/dL (ref 30.0–36.0)
MCV: 100.3 fL — ABNORMAL HIGH (ref 80.0–100.0)
Monocytes Absolute: 0.3 10*3/uL (ref 0.1–1.0)
Monocytes Relative: 5 %
Neutro Abs: 4.5 10*3/uL (ref 1.7–7.7)
Neutrophils Relative %: 84 %
Platelets: 244 10*3/uL (ref 150–400)
RBC: 3.36 MIL/uL — ABNORMAL LOW (ref 4.22–5.81)
RDW: 15.6 % — ABNORMAL HIGH (ref 11.5–15.5)
WBC: 5.3 10*3/uL (ref 4.0–10.5)
nRBC: 0 % (ref 0.0–0.2)

## 2024-02-26 MED ORDER — SODIUM CHLORIDE 0.9 % IV BOLUS
1000.0000 mL | Freq: Once | INTRAVENOUS | Status: AC
  Administered 2024-02-26: 1000 mL via INTRAVENOUS

## 2024-02-26 MED ORDER — SODIUM CHLORIDE 0.9 % IV BOLUS: 1000.0000 mL | Freq: Once | INTRAVENOUS | Status: DC

## 2024-02-26 MED ORDER — LUTETIUM LU 177 VIPIVOTIDE TET 1000 MBQ/ML IV SOLN
210.9500 | Freq: Once | INTRAVENOUS | Status: AC
  Administered 2024-02-26: 210.95 via INTRAVENOUS

## 2024-02-26 NOTE — Progress Notes (Signed)
 CLINICAL DATA: [83 year old male with castrate resistant metastatic prostate carcinoma.  Metastatic adenopathy in the chest abdomen pelvis.  Skeletal lesion.  Increasing PSA.]    EXAM: NUCLEAR MEDICINE PLUVICTO INJECTION  TECHNIQUE: Infusion: The nuclear medicine technologist and I personally verified the dose activity to be delivered as specified in the written directive, and verified the patient identification via 2 separate methods.  Initial flush of the intravenous catheter was performed was sterile saline. The dose syringe was connected to the catheter and the Lu-177 Pluvicto administered over a 1 to 10 min infusion. Single 10 cc  lushes with normal saline follow the dose. No complications were noted. The entire IV tubing, venocatheter, stopcock and syringes was removed in total, placed in a disposal bag and sent for assay of the residual activity, which will be reported at a later time in our EMR by the physics staff. Pressure was applied to the venipuncture site, and a compression bandage placed. Patient monitored for 1 hour following infusion.    Radiation Safety personnel were present to perform the discharge survey, as detailed on their documentation. After a short period of observation, the patient had his IV removed.  RADIOPHARMACEUTICALS: [210.9] microcuries Lu-177 PLUVICTO  FINDINGS: Current Infusion: [1]  Planned Infusions: 6    Patient presented to nuclear medicine for treatment. The patient's most recent blood counts were reviewed and remains a good candidate to proceed with Lu-177 Pluvicto.    Patient has mild renal insufficiency.  Creatinine equal 1.7.  Patient advised to hydrate well following therapy.  Patient received 1 L of fluids at time drug administration.     Mild anemia noted.  Hemoglobin 10.5     PSA equal equal 240 increased from 124 one month prior.     Patient advised of risk and benefits of  Lu-PSMA  therapy.  Patient and daughter present.   All questions answered.  Consent forms signed.       The patient was situated in an infusion suite with a contact barrier placed under the arm. Intravenous access was established, using sterile technique, and a normal saline infusion from a syringe was started.     Micro-dosimetry: The prescribed radiation activity was assayed and confirmed to be within specified tolerance.  IMPRESSION: Current Infusion: [1]  Planned Infusions: 6    [The patient tolerated the infusion well. The patient will return in 6 weeks for ongoing care.]

## 2024-02-26 NOTE — Written Directive (Addendum)
  PLUVICTO  THERAPY   RADIOPHARMACEUTICAL: Lutetium 177 vipivotide tetraxetan (Pluvicto)     PRESCRIBED DOSE FOR ADMINISTRATION:  200 mCi   ROUTE OFADMINISTRATION:  IV   DIAGNOSIS:  metastasis from hormone refractory prostate cancer   REFERRING PHYSICIAN:Chang, Rubens   TREATMENT #: 1   ADDITIONAL PHYSICIAN COMMENTS/NOTES:   AUTHORIZED USER SIGNATURE & TIME STAMP: Patriciaann Clan, MD   02/26/24    10:40 AM

## 2024-03-03 ENCOUNTER — Inpatient Hospital Stay: Payer: Medicare Other

## 2024-03-04 DIAGNOSIS — E119 Type 2 diabetes mellitus without complications: Secondary | ICD-10-CM | POA: Diagnosis not present

## 2024-03-17 ENCOUNTER — Other Ambulatory Visit

## 2024-03-17 ENCOUNTER — Ambulatory Visit

## 2024-03-28 ENCOUNTER — Other Ambulatory Visit: Payer: Self-pay

## 2024-03-28 DIAGNOSIS — C61 Malignant neoplasm of prostate: Secondary | ICD-10-CM

## 2024-03-28 DIAGNOSIS — D539 Nutritional anemia, unspecified: Secondary | ICD-10-CM

## 2024-03-28 DIAGNOSIS — C799 Secondary malignant neoplasm of unspecified site: Secondary | ICD-10-CM | POA: Diagnosis not present

## 2024-03-28 DIAGNOSIS — N1832 Chronic kidney disease, stage 3b: Secondary | ICD-10-CM | POA: Diagnosis not present

## 2024-03-28 NOTE — Assessment & Plan Note (Addendum)
 Continue B12 1000 mcg daily.

## 2024-03-28 NOTE — Progress Notes (Signed)
 [facility] OFFICE PROGRESS NOTE  Patient Care Team: Jacqueline Matsu, MD as PCP - General (Pulmonary Disease) Arty Binning, MD (Inactive) as PCP - Cardiology (Cardiology) Katheleen Palmer, RN as Oncology Nurse Navigator  Ian Dudley is a 83 y.o.male with history of HTN, HLD, being follow up at [facility] for recurrent prostate cancer. 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 He had rising PSA and enrolled into [facility] in 2021. PSMA PET in September 2024 showed lymphadenopathy above and below the diaphragm. Repeat staging in 12/2023 showed progression metastatic adenopathy. He started docetaxel  on 01/14/24 at reduced dose due to his age.   Negative genetic testing on the CancerNext+RNAinsight panel. RAD51D p.R165W (c.493C>T) VUS identified.    Current diagnosis: mCRPC Initial diagnosis: 10/15/2007.  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 Extensive perineural invasion. PSA in 12/2007 was up to 21.  Progression: PSA 0.1 in 12/2008 and 0.2 in 07/2009.  Germline Ambry testing negative. VUS RAD51D Somatic testing: TMB low. pMMR/MSI stable. PDL1 negative. HRD negative. Previous treatment: prostatectomy, salvage radiation, ADT/ARPI   Most recent treatment: docetaxel . 02/05/24 completed cycle 2 with cytopenia.   Progression of disease with risking PSA. Recommend Pluvicto .  02/26/24 Completed 1 dose.  Lab monitoring for cytopenia.   ADT at [facility] every 6 months. Next in June  Fatigue and decrease appetite x 2 weeks post treatment. 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 Macrocytic  anemia From b12 deficiency. Normal folate Other vitamin B12 deficiency anemia Continue B12 1000 mcg daily Stage 3b chronic kidney disease (HCC) Recommend him to increase hydration 60-70 oz per day Monitor with lab every 6 weeks before Pluvicto  At risk for side effect of medication Dexa scan about 2 weeks after next Pluvicto  Decreased appetite With fatigue 2-3 weeks after Pluvicto  Dexamethasone  2 times weekly as needed. Other fatigue Ok to use dexamethasone  2 mg twice weekly as needed.  Orders Placed This Encounter  Procedures   DG Bone Density    Standing Status:   Future    Expected Date:   04/30/2024    Expiration Date:   03/31/2025    Reason for Exam (SYMPTOM  OR DIAGNOSIS REQUIRED):   assess osteoporosis.    Preferred imaging location?:   [facility]   CBC with Differential (Cancer Center Only)    Standing Status:   Future    Expiration Date:   03/31/2025   CMP (Cancer Center only)    Standing Status:   Future    Expiration Date:   03/31/2025   Prostate-Specific AG, Serum    Standing Status:   Future    Expiration Date:   03/31/2025   Testosterone     Standing Status:   Future    Expiration Date:   03/31/2025   Sample to Blood Bank    Standing Status:   Future    Expiration Date:   03/31/2025   Return on 6/10 with lab and then visit.   Lowanda Ruddy, MD  INTERVAL HISTORY: Erol returns for treatment follow-up Complications related to previous cycle of chemotherapy included fatigue, and short of breath.  He had a cough resolved. No fever. He took two steroid and felt  better.  Things tasted different and almost normal.   Not much dry mouth.  No trouble eating. He was not drinking as much.  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 Zannie Hey) prostatectomy  Gleason 4+4 prostatic adenocarcinoma with extracapsular extension on right side, negative margins and  negative seminal vesicle invasion. Extensive perineural invasion.   12/2008 Tumor Marker   PSA 0.1   07/2009 Tumor Marker   PSA 0.2   11/2021 Tumor Marker   PSA 4.26   03/2022 Tumor Marker   PSA 12.2   03/2022 -  Chemotherapy   Started Marvis Sluder Report he could not take continuously due to cost.   12/2022 Tumor Marker   PSA 26   03/2023 Tumor Marker   PSA 33   07/2023 Tumor Marker   PSA 73.3   07/23/2023 Imaging   CT AP new pathologic retrocrural and para-aortic adenopathy, with enlarging lower thoracic para-aortic adenopathy; no definite pathologic adenopathy in pelvis.      08/15/2023 Procedure   A. LYMPH NODE, LEFT SIDED RETROPERITONEAL NODAL MASS, NEEDLE CORE BIOPSY:      Metastatic carcinoma, consistent with prostate primary.       See comment.   COMMENT:   Immunohistochemical stains for NKX3.1 and prostein were performed and  are positive in the tumor cells. The findings are supportive of  metastatic carcinoma with prostate primary.   Controls worked appropriately.    08/21/2023 PET scan   PSMA PET IMPRESSION: Prior prostatectomy. No evidence of local tumor recurrence.   Metastatic lymphadenopathy in the left lower neck, mediastinum, bilateral retrocrural regions, and abdominal retroperitoneum in left para-aortic region.   No evidence of pelvic metastatic lymphadenopathy or skeletal metastases.   Emphysema (ICD10-J43.9).   08/2023 Miscellaneous   Consultation with radiation oncology.  PSMA PET performed.  Radiation treatment is not a suitable option due to the extent of the disease.    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  RAD51D  p.R165W (c.493C>T) VUS identified.  The report date is January 01, 2024.  The [facility] CancerNext+RNAinsight Panel  includes sequencing, rearrangement analysis, and RNA analysis for the following 39 genes: APC, ATM, BAP1, BARD1, BMPR1A, BRCA1, BRCA2, BRIP1, CDH1, CDKN2A, CHEK2, FH, FLCN, MET, MLH1, MSH2, MSH6, MUTYH, NF1, NTHL1, PALB2, PMS2, PTEN, RAD51C, RAD51D, SMAD4, STK11, TP53, TSC1, TSC2, and VHL (sequencing and deletion/duplication); AXIN2, HOXB13, MBD4, MSH3, POLD1 and POLE (sequencing only); EPCAM and GREM1 (deletion/duplication only).    01/14/2024 - 02/05/2024 Chemotherapy   Patient is on Treatment Plan : PROSTATE Docetaxel  (75) + Prednisone  q14d        PHYSICAL EXAMINATION: ECOG PERFORMANCE STATUS: 1 - Symptomatic but completely ambulatory  Vitals:   03/31/24 1254  BP: (!) 104/47  Pulse: 75  Resp: 16  Temp: (!) 97.5 F (36.4 C)  SpO2: 100%   Filed Weights   03/31/24 1254  Weight: 201 lb 5 oz (91.3 kg)   No distress. No lightheadedness on standing up  Relevant data reviewed during this visit included labs.

## 2024-03-28 NOTE — Assessment & Plan Note (Addendum)
 Will continue Pluvicto  on 5/6 Follow up one week before each Pluvicto  for CBC, T&S and transfusion if needed.

## 2024-03-28 NOTE — Assessment & Plan Note (Addendum)
 Recommend him to increase hydration 60-70 oz per day Monitor with lab every 6 weeks before Pluvicto

## 2024-03-28 NOTE — Assessment & Plan Note (Addendum)
 From b12 deficiency. Normal folate

## 2024-03-31 ENCOUNTER — Inpatient Hospital Stay

## 2024-03-31 VITALS — BP 104/47 | HR 75 | Temp 97.5°F | Resp 16 | Wt 201.3 lb

## 2024-03-31 DIAGNOSIS — R5383 Other fatigue: Secondary | ICD-10-CM | POA: Insufficient documentation

## 2024-03-31 DIAGNOSIS — N1832 Chronic kidney disease, stage 3b: Secondary | ICD-10-CM | POA: Diagnosis not present

## 2024-03-31 DIAGNOSIS — C61 Malignant neoplasm of prostate: Secondary | ICD-10-CM

## 2024-03-31 DIAGNOSIS — D519 Vitamin B12 deficiency anemia, unspecified: Secondary | ICD-10-CM | POA: Diagnosis not present

## 2024-03-31 DIAGNOSIS — D518 Other vitamin B12 deficiency anemias: Secondary | ICD-10-CM | POA: Diagnosis not present

## 2024-03-31 DIAGNOSIS — Z9189 Other specified personal risk factors, not elsewhere classified: Secondary | ICD-10-CM

## 2024-03-31 DIAGNOSIS — C799 Secondary malignant neoplasm of unspecified site: Secondary | ICD-10-CM

## 2024-03-31 DIAGNOSIS — Z79899 Other long term (current) drug therapy: Secondary | ICD-10-CM | POA: Diagnosis not present

## 2024-03-31 DIAGNOSIS — D539 Nutritional anemia, unspecified: Secondary | ICD-10-CM | POA: Insufficient documentation

## 2024-03-31 DIAGNOSIS — R630 Anorexia: Secondary | ICD-10-CM | POA: Insufficient documentation

## 2024-03-31 LAB — CBC WITH DIFFERENTIAL (CANCER CENTER ONLY)
Abs Immature Granulocytes: 0.01 10*3/uL (ref 0.00–0.07)
Basophils Absolute: 0 10*3/uL (ref 0.0–0.1)
Basophils Relative: 0 %
Eosinophils Absolute: 0.1 10*3/uL (ref 0.0–0.5)
Eosinophils Relative: 2 %
HCT: 29.3 % — ABNORMAL LOW (ref 39.0–52.0)
Hemoglobin: 9.5 g/dL — ABNORMAL LOW (ref 13.0–17.0)
Immature Granulocytes: 0 %
Lymphocytes Relative: 15 %
Lymphs Abs: 0.5 10*3/uL — ABNORMAL LOW (ref 0.7–4.0)
MCH: 31.3 pg (ref 26.0–34.0)
MCHC: 32.4 g/dL (ref 30.0–36.0)
MCV: 96.4 fL (ref 80.0–100.0)
Monocytes Absolute: 0.3 10*3/uL (ref 0.1–1.0)
Monocytes Relative: 10 %
Neutro Abs: 2.3 10*3/uL (ref 1.7–7.7)
Neutrophils Relative %: 73 %
Platelet Count: 202 10*3/uL (ref 150–400)
RBC: 3.04 MIL/uL — ABNORMAL LOW (ref 4.22–5.81)
RDW: 17.6 % — ABNORMAL HIGH (ref 11.5–15.5)
WBC Count: 3.2 10*3/uL — ABNORMAL LOW (ref 4.0–10.5)
nRBC: 0 % (ref 0.0–0.2)

## 2024-03-31 LAB — CMP (CANCER CENTER ONLY)
ALT: 7 U/L (ref 0–44)
AST: 15 U/L (ref 15–41)
Albumin: 3.5 g/dL (ref 3.5–5.0)
Alkaline Phosphatase: 52 U/L (ref 38–126)
Anion gap: 5 (ref 5–15)
BUN: 37 mg/dL — ABNORMAL HIGH (ref 8–23)
CO2: 27 mmol/L (ref 22–32)
Calcium: 8.8 mg/dL — ABNORMAL LOW (ref 8.9–10.3)
Chloride: 108 mmol/L (ref 98–111)
Creatinine: 1.65 mg/dL — ABNORMAL HIGH (ref 0.61–1.24)
GFR, Estimated: 41 mL/min — ABNORMAL LOW (ref >60)
Glucose, Bld: 95 mg/dL (ref 70–99)
Potassium: 4.8 mmol/L (ref 3.5–5.1)
Sodium: 140 mmol/L (ref 135–145)
Total Bilirubin: 0.4 mg/dL (ref 0.0–1.2)
Total Protein: 6.2 g/dL — ABNORMAL LOW (ref 6.5–8.1)

## 2024-03-31 LAB — ABO/RH: ABO/RH(D): A POS

## 2024-03-31 LAB — SAMPLE TO BLOOD BANK

## 2024-03-31 MED ORDER — SODIUM CHLORIDE 0.9% FLUSH
10.0000 mL | Freq: Once | INTRAVENOUS | Status: AC
  Administered 2024-03-31: 10 mL

## 2024-03-31 MED ORDER — HEPARIN SOD (PORK) LOCK FLUSH 100 UNIT/ML IV SOLN
250.0000 [IU] | Freq: Once | INTRAVENOUS | Status: AC
  Administered 2024-03-31: 250 [IU]

## 2024-03-31 MED ORDER — DEXAMETHASONE 2 MG PO TABS: 2.0000 mg | ORAL_TABLET | ORAL | 0 refills | Status: DC

## 2024-03-31 NOTE — Assessment & Plan Note (Signed)
 Dexa scan about 2 weeks after next Pluvicto 

## 2024-03-31 NOTE — Assessment & Plan Note (Addendum)
 With fatigue 2-3 weeks after Pluvicto  Dexamethasone  2 times weekly as needed.

## 2024-04-01 LAB — TESTOSTERONE: Testosterone: 5 ng/dL — ABNORMAL LOW (ref 264–916)

## 2024-04-01 LAB — PROSTATE-SPECIFIC AG, SERUM (LABCORP): Prostate Specific Ag, Serum: 37.5 ng/mL — ABNORMAL HIGH (ref 0.0–4.0)

## 2024-04-03 ENCOUNTER — Other Ambulatory Visit (HOSPITAL_COMMUNITY)

## 2024-04-03 DIAGNOSIS — E119 Type 2 diabetes mellitus without complications: Secondary | ICD-10-CM | POA: Diagnosis not present

## 2024-04-08 ENCOUNTER — Ambulatory Visit (HOSPITAL_COMMUNITY)
Admission: RE | Admit: 2024-04-08 | Discharge: 2024-04-08 | Disposition: A | Discharge status: Home / Self Care | Source: Ambulatory Visit

## 2024-04-08 DIAGNOSIS — C61 Malignant neoplasm of prostate: Secondary | ICD-10-CM | POA: Diagnosis not present

## 2024-04-08 DIAGNOSIS — N289 Disorder of kidney and ureter, unspecified: Secondary | ICD-10-CM | POA: Diagnosis not present

## 2024-04-08 DIAGNOSIS — D631 Anemia in chronic kidney disease: Secondary | ICD-10-CM | POA: Diagnosis not present

## 2024-04-08 DIAGNOSIS — C799 Secondary malignant neoplasm of unspecified site: Secondary | ICD-10-CM | POA: Diagnosis not present

## 2024-04-08 DIAGNOSIS — T451X5A Adverse effect of antineoplastic and immunosuppressive drugs, initial encounter: Secondary | ICD-10-CM | POA: Diagnosis not present

## 2024-04-08 MED ORDER — SODIUM CHLORIDE 0.9 % IV BOLUS
1000.0000 mL | Freq: Once | INTRAVENOUS | Status: AC
  Administered 2024-04-08: 1000 mL via INTRAVENOUS

## 2024-04-08 MED ORDER — SODIUM CHLORIDE 0.9 % IV BOLUS: 1000.0000 mL | Freq: Once | INTRAVENOUS | Status: AC

## 2024-04-08 MED ORDER — LUTETIUM LU 177 VIPIVOTIDE TET 1000 MBQ/ML IV SOLN
203.8590 | Freq: Once | INTRAVENOUS | Status: AC
  Administered 2024-04-08: 203.859 via INTRAVENOUS

## 2024-04-08 NOTE — Written Directive (Addendum)
  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

## 2024-04-08 NOTE — Progress Notes (Signed)
 CLINICAL DATA: [83 year old male with castrate resistant metastatic prostate carcinoma.  Patient presents for second Lu 177  PSMA therapy.]    EXAM: NUCLEAR MEDICINE PLUVICTO  INJECTION  TECHNIQUE: Infusion: The nuclear medicine technologist and I personally verified the dose activity to be delivered as specified in the written directive, and verified the patient identification via 2 separate methods.  Initial flush of the intravenous catheter was performed was sterile saline. The dose syringe was connected to the catheter and the Lu-177 Pluvicto  administered over a 1 to 10 min infusion. Single 10 cc  lushes with normal saline follow the dose. No complications were noted. The entire IV tubing, venocatheter, stopcock and syringes was removed in total, placed in a disposal bag and sent for assay of the residual activity, which will be reported at a later time in our EMR by the physics staff. Pressure was applied to the venipuncture site, and a compression bandage placed. Patient monitored for 1 hour following infusion.     RADIOPHARMACEUTICALS: [204] microcuries Lu-177 PLUVICTO   FINDINGS: Current Infusion: [2]  Planned Infusions: 6    Patient presented to nuclear medicine for treatment.  Patient reports fairly severe fatigue for several weeks following initial therapy.  No nausea or vomiting.      The patient's most recent blood counts were reviewed and remains a good candidate to proceed with Lu-177 Pluvicto .   Dramatic reduction in PSA (85%).  PSA equal 37.5 decreased from 240.    Patient's mild renal insufficiency and anemia are stable.     The patient was situated in an infusion suite with a contact barrier placed under the arm. Intravenous access was established, using sterile technique, and a normal saline infusion from a syringe was started.  There is some difficulty gaining IV access.  Ultrasound employed.     Patient may benefit from utilizing the chemotherapy port for  next treatment.     Micro-dosimetry: The prescribed radiation activity was assayed and confirmed to be within specified tolerance.  IMPRESSION: Current Infusion: [2]  Planned Infusions: 6    [The patient tolerated the infusion well. The patient will return in 6 weeks for ongoing care.]

## 2024-04-14 ENCOUNTER — Ambulatory Visit

## 2024-04-14 ENCOUNTER — Other Ambulatory Visit

## 2024-04-29 ENCOUNTER — Other Ambulatory Visit

## 2024-04-29 ENCOUNTER — Ambulatory Visit

## 2024-04-29 DIAGNOSIS — I119 Hypertensive heart disease without heart failure: Secondary | ICD-10-CM | POA: Diagnosis not present

## 2024-04-29 DIAGNOSIS — M1990 Unspecified osteoarthritis, unspecified site: Secondary | ICD-10-CM | POA: Diagnosis not present

## 2024-04-29 DIAGNOSIS — E7800 Pure hypercholesterolemia, unspecified: Secondary | ICD-10-CM | POA: Diagnosis not present

## 2024-04-29 DIAGNOSIS — E119 Type 2 diabetes mellitus without complications: Secondary | ICD-10-CM | POA: Diagnosis not present

## 2024-04-29 DIAGNOSIS — Z79899 Other long term (current) drug therapy: Secondary | ICD-10-CM | POA: Diagnosis not present

## 2024-04-29 DIAGNOSIS — M899 Disorder of bone, unspecified: Secondary | ICD-10-CM | POA: Diagnosis not present

## 2024-04-30 ENCOUNTER — Other Ambulatory Visit: Payer: Self-pay

## 2024-04-30 DIAGNOSIS — C61 Malignant neoplasm of prostate: Secondary | ICD-10-CM

## 2024-05-04 DIAGNOSIS — E119 Type 2 diabetes mellitus without complications: Secondary | ICD-10-CM | POA: Diagnosis not present

## 2024-05-06 DIAGNOSIS — N1832 Chronic kidney disease, stage 3b: Secondary | ICD-10-CM | POA: Diagnosis not present

## 2024-05-06 DIAGNOSIS — C61 Malignant neoplasm of prostate: Secondary | ICD-10-CM | POA: Diagnosis not present

## 2024-05-06 DIAGNOSIS — C7951 Secondary malignant neoplasm of bone: Secondary | ICD-10-CM | POA: Diagnosis not present

## 2024-05-06 DIAGNOSIS — E1122 Type 2 diabetes mellitus with diabetic chronic kidney disease: Secondary | ICD-10-CM | POA: Diagnosis not present

## 2024-05-10 NOTE — Assessment & Plan Note (Signed)
 Pluvicto  3/25 C1. 5/6 C2 Will continue Pluvicto  on 6/17 Next appointment at [facility] will be next Thursday. Last ADT was in March Follow up before each Pluvicto  for CBC, T&S and transfusion if needed.

## 2024-05-10 NOTE — Assessment & Plan Note (Addendum)
 Ongoing treatment and recent chemotherapy Will continue monitor

## 2024-05-10 NOTE — Assessment & Plan Note (Signed)
 Recommend him to increase hydration 60-70 oz per day Monitor with lab every 6 weeks before Pluvicto

## 2024-05-10 NOTE — Assessment & Plan Note (Signed)
 Continue B12 1000 mcg daily Repeat lab next time.

## 2024-05-10 NOTE — Assessment & Plan Note (Signed)
 Dexa scan about 2 weeks after next Pluvicto  Continue vit D and calcium Continue exercises and walking as able. Continue dental care Aggressive CV risk management.

## 2024-05-12 ENCOUNTER — Inpatient Hospital Stay

## 2024-05-12 VITALS — BP 133/47 | HR 69 | Temp 97.0°F | Resp 18 | Wt 202.8 lb

## 2024-05-12 DIAGNOSIS — N1832 Chronic kidney disease, stage 3b: Secondary | ICD-10-CM | POA: Insufficient documentation

## 2024-05-12 DIAGNOSIS — D518 Other vitamin B12 deficiency anemias: Secondary | ICD-10-CM

## 2024-05-12 DIAGNOSIS — D519 Vitamin B12 deficiency anemia, unspecified: Secondary | ICD-10-CM | POA: Insufficient documentation

## 2024-05-12 DIAGNOSIS — C799 Secondary malignant neoplasm of unspecified site: Secondary | ICD-10-CM

## 2024-05-12 DIAGNOSIS — Z79899 Other long term (current) drug therapy: Secondary | ICD-10-CM | POA: Diagnosis not present

## 2024-05-12 DIAGNOSIS — D72810 Lymphocytopenia: Secondary | ICD-10-CM | POA: Insufficient documentation

## 2024-05-12 DIAGNOSIS — Z9189 Other specified personal risk factors, not elsewhere classified: Secondary | ICD-10-CM

## 2024-05-12 DIAGNOSIS — C61 Malignant neoplasm of prostate: Secondary | ICD-10-CM

## 2024-05-12 DIAGNOSIS — D539 Nutritional anemia, unspecified: Secondary | ICD-10-CM

## 2024-05-12 LAB — CMP (CANCER CENTER ONLY)
ALT: 7 U/L (ref 0–44)
AST: 15 U/L (ref 15–41)
Albumin: 3.5 g/dL (ref 3.5–5.0)
Alkaline Phosphatase: 49 U/L (ref 38–126)
Anion gap: 5 (ref 5–15)
BUN: 32 mg/dL — ABNORMAL HIGH (ref 8–23)
CO2: 28 mmol/L (ref 22–32)
Calcium: 9 mg/dL (ref 8.9–10.3)
Chloride: 108 mmol/L (ref 98–111)
Creatinine: 1.51 mg/dL — ABNORMAL HIGH (ref 0.61–1.24)
GFR, Estimated: 45 mL/min — ABNORMAL LOW (ref >60)
Glucose, Bld: 91 mg/dL (ref 70–99)
Potassium: 4.3 mmol/L (ref 3.5–5.1)
Sodium: 141 mmol/L (ref 135–145)
Total Bilirubin: 0.3 mg/dL (ref 0.0–1.2)
Total Protein: 6.1 g/dL — ABNORMAL LOW (ref 6.5–8.1)

## 2024-05-12 LAB — CBC WITH DIFFERENTIAL (CANCER CENTER ONLY)
Abs Immature Granulocytes: 0 10*3/uL (ref 0.00–0.07)
Basophils Absolute: 0 10*3/uL (ref 0.0–0.1)
Basophils Relative: 1 %
Eosinophils Absolute: 0 10*3/uL (ref 0.0–0.5)
Eosinophils Relative: 1 %
HCT: 28.7 % — ABNORMAL LOW (ref 39.0–52.0)
Hemoglobin: 9.4 g/dL — ABNORMAL LOW (ref 13.0–17.0)
Immature Granulocytes: 0 %
Lymphocytes Relative: 15 %
Lymphs Abs: 0.4 10*3/uL — ABNORMAL LOW (ref 0.7–4.0)
MCH: 32.5 pg (ref 26.0–34.0)
MCHC: 32.8 g/dL (ref 30.0–36.0)
MCV: 99.3 fL (ref 80.0–100.0)
Monocytes Absolute: 0.3 10*3/uL (ref 0.1–1.0)
Monocytes Relative: 11 %
Neutro Abs: 2.1 10*3/uL (ref 1.7–7.7)
Neutrophils Relative %: 72 %
Platelet Count: 176 10*3/uL (ref 150–400)
RBC: 2.89 MIL/uL — ABNORMAL LOW (ref 4.22–5.81)
RDW: 16.2 % — ABNORMAL HIGH (ref 11.5–15.5)
WBC Count: 2.9 10*3/uL — ABNORMAL LOW (ref 4.0–10.5)
nRBC: 0 % (ref 0.0–0.2)

## 2024-05-12 LAB — SAMPLE TO BLOOD BANK

## 2024-05-12 MED ORDER — SODIUM CHLORIDE 0.9% FLUSH
10.0000 mL | Freq: Once | INTRAVENOUS | Status: AC
  Administered 2024-05-12: 10 mL

## 2024-05-12 NOTE — Progress Notes (Signed)
 [facility] OFFICE PROGRESS NOTE  Patient Care Team: Jacqueline Matsu, MD as PCP - General (Pulmonary Disease) Arty Binning, MD (Inactive) as PCP - Cardiology (Cardiology) Katheleen Palmer, RN as Oncology Nurse Navigator  Ian Dudley is a 83 y.o.male with history of HTN, HLD, being [facility] for recurrent prostate cancer. 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 He had rising PSA and enrolled into [facility] in 2021. PSMA PET in September 2024 showed lymphadenopathy above and below the diaphragm. Repeat staging in 12/2023 showed progression metastatic adenopathy. He started docetaxel  on 01/14/24 at reduced dose due to his age.   Negative genetic testing on the CancerNext+RNAinsight panel. RAD51D p.R165W (c.493C>T) VUS identified.   Current diagnosis: mCRPC Initial diagnosis: 10/15/2007.  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 Extensive perineural invasion. PSA in 12/2007 was up to 21.  Progression: PSA 0.1 in 12/2008 and 0.2 in 07/2009.  Germline Ambry testing negative. VUS RAD51D Somatic testing: TMB low. pMMR/MSI stable. PDL1 negative. HRD negative. Previous treatment: prostatectomy, salvage radiation, ADT/ARPI docetaxel . 02/05/24 completed cycle 2 with cytopenia.   Current treatment: 02/26/24 Started Pluvicto .    Lab monitoring for cytopenia.  Follow up on 7/25. Lab and visit and potentially one unit of pRBC after my visit.  Discussed BMA and dental clearance. 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 5/6 C2 Will continue Pluvicto  on 6/17 [facility] appointment [facility] will be next Thursday. Last ADT was in March Follow up before each Pluvicto  for CBC, T&S and  transfusion if needed. Other vitamin B12 deficiency anemia Continue B12 1000 mcg daily Repeat lab next time. Lymphopenia Ongoing treatment and recent chemotherapy Will continue monitor Stage 3b chronic kidney disease (HCC) Recommend him to increase hydration 60-70 oz per day Monitor with lab every 6 weeks before Pluvicto  At risk for side effect of medication Dexa scan about 2 weeks after next Pluvicto  Continue vit D and calcium Continue exercises and walking as able. Continue dental care Aggressive CV risk management.  Orders Placed This Encounter  Procedures   CBC with Differential (Cancer Center Only)    Standing Status:   Future    Expiration Date:   05/12/2025   CMP (Cancer Center only)    Standing Status:   Future    Expiration Date:   05/12/2025   Vitamin B12    Standing Status:   Future    Expiration Date:   05/12/2025   Prostate-Specific AG, Serum    Standing Status:   Future    Expiration Date:   05/12/2025   Testosterone     Standing Status:   Future    Expiration Date:   05/12/2025   Sample to Blood Bank    Standing Status:   Future    Expiration Date:   05/12/2025     Lowanda Ruddy, MD  INTERVAL HISTORY: Patient returns for follow-up. 2nd treatment was good. No side effects, no fatigue like the first. No dry mouth or dry. He drinks about 60 - 70 oz of fluid per day. No trouble urinating. No other new symptoms.  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 Zannie Hey) prostatectomy  Gleason 4+4  prostatic adenocarcinoma with extracapsular extension on right side, negative margins and negative seminal vesicle invasion. Extensive perineural invasion.   12/2008 Tumor Marker   PSA 0.1   07/2009 Tumor Marker   PSA 0.2   11/2021 Tumor Marker   PSA 4.26   03/2022 Tumor Marker   PSA 12.2   03/2022 -  Chemotherapy   Started Marvis Sluder Report he could not take continuously due  to cost.   12/2022 Tumor Marker   PSA 26   03/2023 Tumor Marker   PSA 33   07/2023 Tumor Marker   PSA 73.3   07/23/2023 Imaging   CT AP new pathologic retrocrural and para-aortic adenopathy, with enlarging lower thoracic para-aortic adenopathy; no definite pathologic adenopathy in pelvis.      08/15/2023 Procedure   A. LYMPH NODE, LEFT SIDED RETROPERITONEAL NODAL MASS, NEEDLE CORE BIOPSY:      Metastatic carcinoma, consistent with prostate primary.       See comment.   COMMENT:   Immunohistochemical stains for NKX3.1 and prostein were performed and  are positive in the tumor cells. The findings are supportive of  metastatic carcinoma with prostate primary.   Controls worked appropriately.    08/21/2023 PET scan   PSMA PET IMPRESSION: Prior prostatectomy. No evidence of local tumor recurrence.   Metastatic lymphadenopathy in the left lower neck, mediastinum, bilateral retrocrural regions, and abdominal retroperitoneum in left para-aortic region.   No evidence of pelvic metastatic lymphadenopathy or skeletal metastases.   Emphysema (ICD10-J43.9).   08/2023 Miscellaneous   Consultation with radiation oncology.  PSMA PET performed.  Radiation treatment is not a suitable option due to the extent of the disease.    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  RAD51D  p.R165W (c.493C>T) VUS identified.  The report date is January 01, 2024.  The [facility] CancerNext+RNAinsight Panel includes sequencing, rearrangement analysis, and RNA analysis for the following 39 genes: APC, ATM, BAP1, BARD1, BMPR1A, BRCA1, BRCA2, BRIP1, CDH1, CDKN2A, CHEK2, FH, FLCN, MET, MLH1, MSH2, MSH6, MUTYH, NF1, NTHL1, PALB2, PMS2, PTEN, RAD51C, RAD51D, SMAD4, STK11, TP53, TSC1, TSC2, and VHL (sequencing  and deletion/duplication); AXIN2, HOXB13, MBD4, MSH3, POLD1 and POLE (sequencing only); EPCAM and GREM1 (deletion/duplication only).    01/14/2024 - 02/05/2024 Chemotherapy   Patient is on Treatment Plan : PROSTATE Docetaxel  (74) + Prednisone  q14d        PHYSICAL EXAMINATION: ECOG PERFORMANCE STATUS: 1 - Symptomatic but completely ambulatory  Vitals:   05/12/24 0954  BP: (!) 133/47  Pulse: 69  Resp: 18  Temp: (!) 97 F (36.1 C)  SpO2: 97%   Filed Weights   05/12/24 0954  Weight: 202 lb 12.8 oz (92 kg)    GENERAL: alert, no distress and comfortable SKIN: skin color normal and no bruising or petechiae or jaundice on exposed skin EYES: normal, sclera clear OROPHARYNX: no exudate  NECK: No palpable mass LYMPH:  no palpable cervical lymphadenopathy  LUNGS: clear to auscultation and percussion with normal breathing effort HEART: regular rate & rhythm  ABDOMEN: abdomen soft, non-tender and nondistended. Musculoskeletal: no point tenderness on spine NEURO: no focal motor/sensory deficits  Relevant data reviewed during this visit included labs.

## 2024-05-13 ENCOUNTER — Ambulatory Visit: Payer: Self-pay

## 2024-05-13 LAB — TESTOSTERONE: Testosterone: 5 ng/dL — ABNORMAL LOW (ref 264–916)

## 2024-05-13 LAB — PROSTATE-SPECIFIC AG, SERUM (LABCORP): Prostate Specific Ag, Serum: 5.4 ng/mL — ABNORMAL HIGH (ref 0.0–4.0)

## 2024-05-13 NOTE — Telephone Encounter (Signed)
 TC to inform of the below message by Dr. Alita Irwin. Pt verbalizes understanding and gratitude.

## 2024-05-13 NOTE — Telephone Encounter (Signed)
-----   Message from Lowanda Ruddy sent at 05/13/2024 11:10 AM EDT ----- Please let him know good news. PSA down to 5.4.

## 2024-05-15 ENCOUNTER — Other Ambulatory Visit (HOSPITAL_COMMUNITY)

## 2024-05-15 DIAGNOSIS — C778 Secondary and unspecified malignant neoplasm of lymph nodes of multiple regions: Secondary | ICD-10-CM | POA: Diagnosis not present

## 2024-05-15 DIAGNOSIS — C61 Malignant neoplasm of prostate: Secondary | ICD-10-CM | POA: Diagnosis not present

## 2024-05-15 DIAGNOSIS — N390 Urinary tract infection, site not specified: Secondary | ICD-10-CM | POA: Diagnosis not present

## 2024-05-15 DIAGNOSIS — N393 Stress incontinence (female) (male): Secondary | ICD-10-CM | POA: Diagnosis not present

## 2024-05-19 NOTE — Written Directive (Addendum)
  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 Janeann Mean   TREATMENT #: 3   ADDITIONAL PHYSICIAN COMMENTS/NOTES:  No labs today AUTHORIZED USER SIGNATURE & TIME STAMP: Reino Carbo, MD   05/20/24    8:15 AM

## 2024-05-20 ENCOUNTER — Ambulatory Visit (HOSPITAL_COMMUNITY)
Admission: RE | Admit: 2024-05-20 | Discharge: 2024-05-20 | Disposition: A | Discharge status: Home / Self Care | Source: Ambulatory Visit

## 2024-05-20 DIAGNOSIS — C61 Malignant neoplasm of prostate: Secondary | ICD-10-CM | POA: Diagnosis not present

## 2024-05-20 DIAGNOSIS — D649 Anemia, unspecified: Secondary | ICD-10-CM | POA: Diagnosis not present

## 2024-05-20 DIAGNOSIS — C775 Secondary and unspecified malignant neoplasm of intrapelvic lymph nodes: Secondary | ICD-10-CM | POA: Diagnosis not present

## 2024-05-20 DIAGNOSIS — C799 Secondary malignant neoplasm of unspecified site: Secondary | ICD-10-CM | POA: Insufficient documentation

## 2024-05-20 DIAGNOSIS — N289 Disorder of kidney and ureter, unspecified: Secondary | ICD-10-CM | POA: Diagnosis not present

## 2024-05-20 MED ORDER — SODIUM CHLORIDE 0.9 % IV BOLUS
1000.0000 mL | Freq: Once | INTRAVENOUS | Status: AC
  Administered 2024-05-20: 1000 mL via INTRAVENOUS

## 2024-05-20 MED ORDER — SODIUM CHLORIDE 0.9 % IV BOLUS: 1000.0000 mL | Freq: Once | INTRAVENOUS | Status: AC

## 2024-05-20 MED ORDER — LUTETIUM LU 177 VIPIVOTIDE TET 1000 MBQ/ML IV SOLN
211.9924 | Freq: Once | INTRAVENOUS | Status: AC
  Administered 2024-05-20: 211.9924 via INTRAVENOUS

## 2024-05-20 MED ORDER — SODIUM CHLORIDE 0.9 % IV BOLUS: 1000.0000 mL | Freq: Once | INTRAVENOUS | Status: DC

## 2024-05-20 NOTE — Progress Notes (Signed)
 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  Metastatic adenopathy the pelvis and thorax.  Patient presents for third Lu 177 PSMA therapy. ]  EXAM: NUCLEAR MEDICINE PLUVICTO  INJECTION  TECHNIQUE: Infusion: The nuclear medicine technologist and I personally verified the dose activity to be delivered as specified in the written directive, and verified the patient identification via 2 separate methods.  Initial flush of the chest port catheter was performed was sterile saline. The dose syringe was connected to the catheter and the Lu-177 Pluvicto  administered over a 1 to 10 min infusion. Single 10 cc  lushes with normal saline follow the dose. No complications were noted.  1000 cc IV fluid was administered through the port.  Pressure was applied to the venipuncture site, and a compression bandage placed. Patient monitored for 1 hour following infusion.    Radiation Safety personnel were present to perform the discharge survey, as detailed on their documentation.     RADIOPHARMACEUTICALS: 212] microcuries Lu-177 PLUVICTO   FINDINGS: Current Infusion: [3]  Planned Infusions: 6    Patient presented to nuclear medicine for treatment. The patient's most recent blood counts were reviewed and remains a good candidate to proceed with Lu-177 Pluvicto .  Patient reports much less fatigue following the second therapy compared to the first.  Patient has mild anemia remain stable.  Stable renal insufficiency.  No mild toxicity or renal toxicity identified.     Patient has PSA continues to decline.  Currently 5.4 reduced from 240 prior to initiation of therapy.     The patient was situated in an infusion suite with a contact barrier placed under the arm. Intravenous access was established, using sterile technique, and a normal saline infusion from a syringe was started.     Micro-dosimetry: The prescribed radiation activity was assayed and confirmed  to be within specified tolerance.    Recommend using chest port port in the future as this was convenient for patient with difficult IV access.   IMPRESSION: Current Infusion: [3]  Planned Infusions: 6    [The patient tolerated the infusion well. The patient will return in 6 weeks for ongoing care.]

## 2024-06-03 DIAGNOSIS — E119 Type 2 diabetes mellitus without complications: Secondary | ICD-10-CM | POA: Diagnosis not present

## 2024-06-26 NOTE — Assessment & Plan Note (Addendum)
 Stable Continue B12 1000 mcg daily Repeat lab today

## 2024-06-26 NOTE — Assessment & Plan Note (Addendum)
 Previously b12 deficiency Repeat today

## 2024-06-26 NOTE — Assessment & Plan Note (Addendum)
 Dexa scan about 2 weeks after next Pluvicto  Continue vit D and calcium Continue exercises and walking as able. Continue dental care Aggressive CV risk management.

## 2024-06-26 NOTE — Assessment & Plan Note (Addendum)
 Pluvicto   3/25 C1.  5/6 C2 6/17 C3 Will continue Pluvicto  on 7/29 [facility] appointment [facility] will be next Thursday. Last ADT was in March Follow up before each Pluvicto  for CBC, T&S and transfusion if needed.

## 2024-06-26 NOTE — Progress Notes (Unsigned)
 [facility] OFFICE PROGRESS NOTE  Patient Care Team: Hillman Bare, MD as PCP - General (Pulmonary Disease) Claudene Victory ORN, MD (Inactive) as PCP - Cardiology (Cardiology) Vertell Pont, RN as Oncology Nurse Navigator  Leory is a 83 y.o.male with history of HTN, HLD, being [facility] for recurrent prostate cancer. 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 He had rising PSA and enrolled into [facility] in 2021. PSMA PET in September 2024 showed lymphadenopathy above and below the diaphragm. Repeat staging in 12/2023 showed progression metastatic adenopathy. He started docetaxel  on 01/14/24 at reduced dose due to his age.    Negative genetic testing on the CancerNext+RNAinsight panel. RAD51D p.R165W (c.493C>T) VUS identified.    Current diagnosis: mCRPC Initial diagnosis: 10/15/2007.  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 Extensive perineural invasion. PSA in 12/2007 was up to 21.  Progression: PSA 0.1 in 12/2008 and 0.2 in 07/2009.  Germline Ambry testing negative. VUS RAD51D Somatic testing: TMB low. pMMR/MSI stable. PDL1 negative. HRD negative. Previous treatment: prostatectomy, salvage radiation, ADT/ARPI docetaxel . 02/05/24 completed cycle 2 with cytopenia.  Now completed 3 cycles of Pluvicto .  PSA is decreasing. 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  5/6 C2 6/17 C3 Will continue Pluvicto  on 7/29 [facility] appointment [facility] will be next Thursday. Last ADT was in March Follow up before each Pluvicto  for CBC, T&S and transfusion if needed. Macrocytic anemia Previously b12 deficiency Repeat today Other vitamin B12 deficiency anemia Continue B12  1000 mcg daily Repeat lab today At risk for side effect of medication Dexa scan about 2 weeks after next Pluvicto  Continue vit D and calcium Continue exercises and walking as able. Continue dental care Aggressive CV risk management.  Return on 8/28 with labs and MD visit.  No orders of the defined types were placed in this encounter.    [name], MD  INTERVAL HISTORY: Patient returns for follow-up.  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 Extensive perineural invasion.   12/2008 Tumor Marker   PSA 0.1   07/2009 Tumor Marker   PSA 0.2   11/2021 Tumor Marker   PSA 4.26   03/2022 Tumor Marker   PSA 12.2   03/2022 -  Chemotherapy   Started Geni Report he could not take continuously due to cost.   12/2022 Tumor Marker   PSA 26   03/2023 Tumor Marker   PSA 33   07/2023 Tumor Marker   PSA 73.3   07/23/2023 Imaging   CT AP new pathologic retrocrural and para-aortic adenopathy, with enlarging lower thoracic para-aortic adenopathy; no definite pathologic adenopathy in pelvis.      08/15/2023 Procedure   A. LYMPH NODE, LEFT SIDED RETROPERITONEAL NODAL MASS, NEEDLE CORE BIOPSY:      Metastatic carcinoma, consistent with prostate primary.       See comment.   COMMENT:   Immunohistochemical stains for NKX3.1 and prostein were performed and  are positive in the tumor cells. The findings are supportive of  metastatic carcinoma with prostate  primary.   Controls worked appropriately.    08/21/2023 PET scan   PSMA PET IMPRESSION: Prior prostatectomy. No evidence of local tumor recurrence.   Metastatic lymphadenopathy in the left lower neck, mediastinum, bilateral retrocrural regions, and abdominal  retroperitoneum in left para-aortic region.   No evidence of pelvic metastatic lymphadenopathy or skeletal metastases.   Emphysema (ICD10-J43.9).   08/2023 Miscellaneous   Consultation with radiation oncology.  PSMA PET performed.  Radiation treatment is not a suitable option due to the extent of the disease.    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  RAD51D  p.R165W (c.493C>T) VUS identified.  The report date is January 01, 2024.  The [facility] CancerNext+RNAinsight Panel includes sequencing, rearrangement analysis, and RNA analysis for the following 39 genes: APC, ATM, BAP1, BARD1, BMPR1A, BRCA1, BRCA2, BRIP1, CDH1, CDKN2A, CHEK2, FH, FLCN, MET, MLH1, MSH2, MSH6, MUTYH, NF1, NTHL1, PALB2, PMS2, PTEN, RAD51C, RAD51D, SMAD4, STK11, TP53, TSC1, TSC2, and VHL (sequencing and deletion/duplication); AXIN2, HOXB13, MBD4, MSH3, POLD1 and POLE (sequencing only); EPCAM and GREM1 (deletion/duplication only).    01/14/2024 - 02/05/2024 Chemotherapy   Patient is on Treatment Plan : PROSTATE Docetaxel  (75) + Prednisone  q14d        PHYSICAL EXAMINATION: ECOG PERFORMANCE STATUS: {CHL ONC ECOG PS:[phone]}  There were no vitals filed for this visit. There were no vitals filed for this visit.  Relevant data reviewed during this visit included ***

## 2024-06-27 ENCOUNTER — Inpatient Hospital Stay

## 2024-06-27 VITALS — BP 127/50 | HR 71 | Temp 97.0°F | Resp 20 | Wt 203.2 lb

## 2024-06-27 DIAGNOSIS — D539 Nutritional anemia, unspecified: Secondary | ICD-10-CM

## 2024-06-27 DIAGNOSIS — D518 Other vitamin B12 deficiency anemias: Secondary | ICD-10-CM | POA: Diagnosis not present

## 2024-06-27 DIAGNOSIS — D519 Vitamin B12 deficiency anemia, unspecified: Secondary | ICD-10-CM | POA: Insufficient documentation

## 2024-06-27 DIAGNOSIS — I10 Essential (primary) hypertension: Secondary | ICD-10-CM | POA: Diagnosis not present

## 2024-06-27 DIAGNOSIS — M25561 Pain in right knee: Secondary | ICD-10-CM

## 2024-06-27 DIAGNOSIS — Z9189 Other specified personal risk factors, not elsewhere classified: Secondary | ICD-10-CM | POA: Diagnosis not present

## 2024-06-27 DIAGNOSIS — Z79899 Other long term (current) drug therapy: Secondary | ICD-10-CM | POA: Insufficient documentation

## 2024-06-27 DIAGNOSIS — C778 Secondary and unspecified malignant neoplasm of lymph nodes of multiple regions: Secondary | ICD-10-CM | POA: Insufficient documentation

## 2024-06-27 DIAGNOSIS — Z9079 Acquired absence of other genital organ(s): Secondary | ICD-10-CM | POA: Diagnosis not present

## 2024-06-27 DIAGNOSIS — C61 Malignant neoplasm of prostate: Secondary | ICD-10-CM

## 2024-06-27 DIAGNOSIS — M25562 Pain in left knee: Secondary | ICD-10-CM

## 2024-06-27 DIAGNOSIS — M25569 Pain in unspecified knee: Secondary | ICD-10-CM | POA: Insufficient documentation

## 2024-06-27 DIAGNOSIS — C799 Secondary malignant neoplasm of unspecified site: Secondary | ICD-10-CM

## 2024-06-27 LAB — CBC WITH DIFFERENTIAL (CANCER CENTER ONLY)
Abs Immature Granulocytes: 0.01 K/uL (ref 0.00–0.07)
Basophils Absolute: 0 K/uL (ref 0.0–0.1)
Basophils Relative: 0 %
Eosinophils Absolute: 0.1 K/uL (ref 0.0–0.5)
Eosinophils Relative: 2 %
HCT: 27.8 % — ABNORMAL LOW (ref 39.0–52.0)
Hemoglobin: 9.2 g/dL — ABNORMAL LOW (ref 13.0–17.0)
Immature Granulocytes: 0 %
Lymphocytes Relative: 18 %
Lymphs Abs: 0.5 K/uL — ABNORMAL LOW (ref 0.7–4.0)
MCH: 33 pg (ref 26.0–34.0)
MCHC: 33.1 g/dL (ref 30.0–36.0)
MCV: 99.6 fL (ref 80.0–100.0)
Monocytes Absolute: 0.3 K/uL (ref 0.1–1.0)
Monocytes Relative: 11 %
Neutro Abs: 1.9 K/uL (ref 1.7–7.7)
Neutrophils Relative %: 69 %
Platelet Count: 164 K/uL (ref 150–400)
RBC: 2.79 MIL/uL — ABNORMAL LOW (ref 4.22–5.81)
RDW: 14.3 % (ref 11.5–15.5)
WBC Count: 2.7 K/uL — ABNORMAL LOW (ref 4.0–10.5)
nRBC: 0 % (ref 0.0–0.2)

## 2024-06-27 LAB — SAMPLE TO BLOOD BANK

## 2024-06-27 LAB — CMP (CANCER CENTER ONLY)
ALT: 7 U/L (ref 0–44)
AST: 15 U/L (ref 15–41)
Albumin: 3.4 g/dL — ABNORMAL LOW (ref 3.5–5.0)
Alkaline Phosphatase: 56 U/L (ref 38–126)
Anion gap: 3 — ABNORMAL LOW (ref 5–15)
BUN: 38 mg/dL — ABNORMAL HIGH (ref 8–23)
CO2: 29 mmol/L (ref 22–32)
Calcium: 8.8 mg/dL — ABNORMAL LOW (ref 8.9–10.3)
Chloride: 109 mmol/L (ref 98–111)
Creatinine: 1.78 mg/dL — ABNORMAL HIGH (ref 0.61–1.24)
GFR, Estimated: 37 mL/min — ABNORMAL LOW (ref >60)
Glucose, Bld: 84 mg/dL (ref 70–99)
Potassium: 4.4 mmol/L (ref 3.5–5.1)
Sodium: 141 mmol/L (ref 135–145)
Total Bilirubin: 0.3 mg/dL (ref 0.0–1.2)
Total Protein: 6.1 g/dL — ABNORMAL LOW (ref 6.5–8.1)

## 2024-06-27 LAB — VITAMIN B12: Vitamin B-12: 358 pg/mL (ref 180–914)

## 2024-06-27 MED ORDER — SODIUM CHLORIDE 0.9% FLUSH
10.0000 mL | Freq: Once | INTRAVENOUS | Status: AC
  Administered 2024-06-27: 10 mL

## 2024-06-27 NOTE — Assessment & Plan Note (Addendum)
 On lisinopril/HCTZ

## 2024-06-27 NOTE — Assessment & Plan Note (Addendum)
 Report worse on Pluvicto . Offered XR. He refused.

## 2024-06-28 LAB — TESTOSTERONE: Testosterone: 6 ng/dL — ABNORMAL LOW (ref 264–916)

## 2024-06-28 LAB — PROSTATE-SPECIFIC AG, SERUM (LABCORP): Prostate Specific Ag, Serum: 1.8 ng/mL (ref 0.0–4.0)

## 2024-06-30 NOTE — Written Directive (Cosign Needed)
  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

## 2024-07-01 ENCOUNTER — Encounter (HOSPITAL_COMMUNITY)
Admission: RE | Admit: 2024-07-01 | Discharge: 2024-07-01 | Disposition: A | Discharge status: Home / Self Care | Source: Ambulatory Visit

## 2024-07-01 DIAGNOSIS — C61 Malignant neoplasm of prostate: Secondary | ICD-10-CM | POA: Diagnosis not present

## 2024-07-01 DIAGNOSIS — C799 Secondary malignant neoplasm of unspecified site: Secondary | ICD-10-CM | POA: Diagnosis not present

## 2024-07-01 MED ORDER — SODIUM CHLORIDE 0.9 % IV BOLUS: 1000.0000 mL | Freq: Once | INTRAVENOUS | Status: DC

## 2024-07-01 MED ORDER — LUTETIUM LU 177 VIPIVOTIDE TET 1000 MBQ/ML IV SOLN
213.0000 | Freq: Once | INTRAVENOUS | Status: AC
  Administered 2024-07-01: 212.909 via INTRAVENOUS

## 2024-07-01 MED ORDER — SODIUM CHLORIDE 0.9 % IV BOLUS
1000.0000 mL | Freq: Once | INTRAVENOUS | Status: AC
  Administered 2024-07-01: 1000 mL via INTRAVENOUS

## 2024-07-01 NOTE — Progress Notes (Signed)
 CLINICAL DATA: Castrate resistant metastatic prostate adenocarcinoma.]  83 year old male.     EXAM: NUCLEAR MEDICINE PLUVICTO  INJECTION  TECHNIQUE: Infusion: The nuclear medicine technologist and I personally verified the dose activity to be delivered as specified in the written directive, and verified the patient identification via 2 separate methods.  Initial flush of the intravenous catheter was performed was sterile saline. The dose syringe was connected to the catheter and the Lu-177 Pluvicto  administered over a 1 to 10 min infusion. Single 10 cc  lushes with normal saline follow the dose. No complications were noted. The entire IV tubing, venocatheter, stopcock and syringes was removed in total, placed in a disposal bag and sent for assay of the residual activity, which will be reported at a later time in our EMR by the physics staff. Pressure was applied to the venipuncture site, and a compression bandage placed. Patient monitored for 1 hour following infusion.    Radiation Safety personnel were present to perform the discharge survey, as detailed on their documentation. After a short period of observation, the patient had his IV removed.  RADIOPHARMACEUTICALS: [Two hundred thirteen] microcuries Lu-177 PLUVICTO   FINDINGS: Current Infusion: [4]  Planned Infusions: 6    Patient presented to nuclear medicine for treatment. The patient's most recent blood counts were reviewed and remains a good candidate to proceed with Lu-177 Pluvicto .     Hemoglobin equal 9.2, white blood cell count equal 2.7.  Values stable from pre therapy values.     Mild renal insufficiency  with creatinine equal 1.78.     PSA continues to decline.  most recent value equal 1.8     The patient was situated in an infusion suite with a contact barrier placed under the arm. Intravenous access was established, using sterile technique, and a normal saline infusion from a syringe was started.      Micro-dosimetry: The prescribed radiation activity was assayed and confirmed to be within specified tolerance.  IMPRESSION: Current Infusion: [4]  Planned Infusions: 6    [The patient tolerated the infusion well. The patient will return in 6 weeks for ongoing care.]

## 2024-07-04 DIAGNOSIS — E119 Type 2 diabetes mellitus without complications: Secondary | ICD-10-CM | POA: Diagnosis not present

## 2024-07-30 NOTE — Progress Notes (Unsigned)
 [facility] OFFICE PROGRESS NOTE  Patient Care Team: Hillman Bare, MD as PCP - General (Pulmonary Disease) Claudene Victory ORN, MD (Inactive) as PCP - Cardiology (Cardiology) Vertell Pont, RN as Oncology Nurse Navigator  Ian Dudley is a 83 y.o.male with history of HTN, HLD, being follow [facility] for recurrent prostate cancer. 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 He had rising PSA and enrolled into [facility] in 2021. PSMA PET in September 2024 showed lymphadenopathy above and below the diaphragm. Repeat staging in 12/2023 showed progression metastatic adenopathy. He started docetaxel  on 01/14/24 at reduced dose due to his age.    Negative genetic testing on the CancerNext+RNAinsight panel. RAD51D p.R165W (c.493C>T) VUS identified.   Current diagnosis: mCRPC Initial diagnosis: 10/15/2007.  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 Extensive perineural invasion. PSA in 12/2007 was up to 21.  Progression: PSA 0.1 in 12/2008 and 0.2 in 07/2009.  Germline Ambry testing negative. VUS RAD51D Somatic testing: TMB low. pMMR/MSI stable. PDL1 negative. HRD negative. Previous treatment: prostatectomy, salvage radiation, ADT/ARPI docetaxel . 02/05/24 completed cycle 2 with cytopenia.  Now completed 3 cycles of Pluvicto .  PSA is decreasing   If PSA does not normalize, and has asymptomatic metastases, consider Provenge. 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  5/6 C2 6/17 C3 7/29 C4 Will continue Pluvicto  on 9/4, 10/21 Consider adding Provenge [facility] appointment [facility] will [facility] for his next ADT . Follow up before each Pluvicto  for CBC, T&S and transfusion if  needed. Other vitamin B12 deficiency anemia Continue B12 1000 mcg daily Repeat lab next draw. Stage 3b chronic kidney disease (HCC) Recommend him to increase hydration 60-70 oz per day Monitor with lab every 6 weeks before Pluvicto  At risk for side effect of medication Dexa scan about 2 weeks after next Pluvicto  Continue vit D and calcium Continue exercises and walking as able. Continue dental care. He will let dentist know if ok with zometa or xgeva Aggressive CV risk management.  Orders Placed This Encounter  Procedures   Vitamin B12    Standing Status:   Future    Expiration Date:   07/31/2025   CBC with Differential (Cancer Center Only)    Standing Status:   Future    Expiration Date:   07/31/2025   Comprehensive metabolic panel with GFR    Standing Status:   Future    Expiration Date:   07/31/2025   Prostate-Specific AG, Serum    Standing Status:   Future    Expiration Date:   07/31/2025   Testosterone     Standing Status:   Future    Expiration Date:   07/31/2025     [name], MD  INTERVAL HISTORY: Patient returns for follow-up. Report a sudden pain on the right hip yesterday and improved today. No other symptoms.  Dry mouth. He is drinking a lot of water.  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 Extensive perineural invasion.   12/2008 Tumor Marker   PSA 0.1   07/2009 Tumor Marker  PSA 0.2   11/2021 Tumor Marker   PSA 4.26   03/2022 Tumor Marker   PSA 12.2   03/2022 -  Chemotherapy   Started Geni Report he could not take continuously due to cost.   12/2022 Tumor Marker   PSA 26   03/2023 Tumor Marker   PSA 33   07/2023 Tumor Marker   PSA 73.3   07/23/2023 Imaging   CT AP new  pathologic retrocrural and para-aortic adenopathy, with enlarging lower thoracic para-aortic adenopathy; no definite pathologic adenopathy in pelvis.      08/15/2023 Procedure   A. LYMPH NODE, LEFT SIDED RETROPERITONEAL NODAL MASS, NEEDLE CORE BIOPSY:      Metastatic carcinoma, consistent with prostate primary.       See comment.   COMMENT:   Immunohistochemical stains for NKX3.1 and prostein were performed and  are positive in the tumor cells. The findings are supportive of  metastatic carcinoma with prostate primary.   Controls worked appropriately.    08/21/2023 PET scan   PSMA PET IMPRESSION: Prior prostatectomy. No evidence of local tumor recurrence.   Metastatic lymphadenopathy in the left lower neck, mediastinum, bilateral retrocrural regions, and abdominal retroperitoneum in left para-aortic region.   No evidence of pelvic metastatic lymphadenopathy or skeletal metastases.   Emphysema (ICD10-J43.9).   08/2023 Miscellaneous   Consultation with radiation oncology.  PSMA PET performed.  Radiation treatment is not a suitable option due to the extent of the disease.    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  RAD51D  p.R165W (c.493C>T) VUS identified.  The report date is January 01, 2024.  The [facility] CancerNext+RNAinsight Panel includes sequencing, rearrangement analysis, and RNA analysis for the following 39 genes: APC, ATM, BAP1, BARD1, BMPR1A, BRCA1, BRCA2, BRIP1, CDH1, CDKN2A, CHEK2, FH, FLCN, MET, MLH1, MSH2, MSH6, MUTYH, NF1, NTHL1, PALB2, PMS2, PTEN, RAD51C, RAD51D, SMAD4, STK11, TP53, TSC1, TSC2, and VHL (sequencing and deletion/duplication); AXIN2, HOXB13, MBD4, MSH3, POLD1 and POLE (sequencing only); EPCAM and GREM1 (deletion/duplication only).    01/14/2024 -  02/05/2024 Chemotherapy   Patient is on Treatment Plan : PROSTATE Docetaxel  (75) + Prednisone  q14d        PHYSICAL EXAMINATION: ECOG PERFORMANCE STATUS: 1 - Symptomatic but completely ambulatory  Vitals:   07/31/24 1306  BP: (!) 138/47  Pulse: 78  Resp: 20  Temp: (!) 97.3 F (36.3 C)  SpO2: 99%   Filed Weights   07/31/24 1306  Weight: 208 lb 3.2 oz (94.4 kg)    GENERAL: alert, no distress and comfortable LUNGS: clear to auscultation and percussion with normal breathing effort HEART: regular rate & rhythm  ABDOMEN: abdomen soft, non-tender and nondistended.   Relevant data reviewed during this visit included labs.

## 2024-07-30 NOTE — Assessment & Plan Note (Signed)
 Continue B12 1000 mcg daily Repeat lab next draw.

## 2024-07-30 NOTE — Assessment & Plan Note (Signed)
 Recommend him to increase hydration 60-70 oz per day Monitor with lab every 6 weeks before Pluvicto

## 2024-07-30 NOTE — Assessment & Plan Note (Signed)
 Pluvicto   3/25 C1.  5/6 C2 6/17 C3 7/29 C4 Will continue Pluvicto  on 9/4, 10/21 Consider adding Provenge Next appointment at [facility] will be next Thursday. Last ADT was in March Follow up before each Pluvicto  for CBC, T&S and transfusion if needed.

## 2024-07-30 NOTE — Assessment & Plan Note (Signed)
 Dexa scan about 2 weeks after next Pluvicto  Continue vit D and calcium Continue exercises and walking as able. Continue dental care Aggressive CV risk management.

## 2024-07-31 ENCOUNTER — Inpatient Hospital Stay

## 2024-07-31 VITALS — BP 138/47 | HR 78 | Temp 97.3°F | Resp 20 | Wt 208.2 lb

## 2024-07-31 DIAGNOSIS — N1832 Chronic kidney disease, stage 3b: Secondary | ICD-10-CM | POA: Insufficient documentation

## 2024-07-31 DIAGNOSIS — Z9079 Acquired absence of other genital organ(s): Secondary | ICD-10-CM | POA: Insufficient documentation

## 2024-07-31 DIAGNOSIS — D518 Other vitamin B12 deficiency anemias: Secondary | ICD-10-CM | POA: Diagnosis not present

## 2024-07-31 DIAGNOSIS — R682 Dry mouth, unspecified: Secondary | ICD-10-CM | POA: Diagnosis not present

## 2024-07-31 DIAGNOSIS — C61 Malignant neoplasm of prostate: Secondary | ICD-10-CM | POA: Insufficient documentation

## 2024-07-31 DIAGNOSIS — M25551 Pain in right hip: Secondary | ICD-10-CM | POA: Insufficient documentation

## 2024-07-31 DIAGNOSIS — D519 Vitamin B12 deficiency anemia, unspecified: Secondary | ICD-10-CM | POA: Insufficient documentation

## 2024-07-31 DIAGNOSIS — Z79899 Other long term (current) drug therapy: Secondary | ICD-10-CM | POA: Insufficient documentation

## 2024-07-31 DIAGNOSIS — C799 Secondary malignant neoplasm of unspecified site: Secondary | ICD-10-CM | POA: Diagnosis not present

## 2024-07-31 DIAGNOSIS — Z9189 Other specified personal risk factors, not elsewhere classified: Secondary | ICD-10-CM

## 2024-07-31 DIAGNOSIS — D539 Nutritional anemia, unspecified: Secondary | ICD-10-CM

## 2024-07-31 LAB — CMP (CANCER CENTER ONLY)
ALT: 7 U/L (ref 0–44)
AST: 14 U/L — ABNORMAL LOW (ref 15–41)
Albumin: 3.4 g/dL — ABNORMAL LOW (ref 3.5–5.0)
Alkaline Phosphatase: 55 U/L (ref 38–126)
Anion gap: 5 (ref 5–15)
BUN: 32 mg/dL — ABNORMAL HIGH (ref 8–23)
CO2: 28 mmol/L (ref 22–32)
Calcium: 8.7 mg/dL — ABNORMAL LOW (ref 8.9–10.3)
Chloride: 109 mmol/L (ref 98–111)
Creatinine: 1.51 mg/dL — ABNORMAL HIGH (ref 0.61–1.24)
GFR, Estimated: 45 mL/min — ABNORMAL LOW (ref >60)
Glucose, Bld: 91 mg/dL (ref 70–99)
Potassium: 4.3 mmol/L (ref 3.5–5.1)
Sodium: 142 mmol/L (ref 135–145)
Total Bilirubin: 0.4 mg/dL (ref 0.0–1.2)
Total Protein: 5.9 g/dL — ABNORMAL LOW (ref 6.5–8.1)

## 2024-07-31 LAB — CBC WITH DIFFERENTIAL (CANCER CENTER ONLY)
Abs Immature Granulocytes: 0 K/uL (ref 0.00–0.07)
Basophils Absolute: 0 K/uL (ref 0.0–0.1)
Basophils Relative: 1 %
Eosinophils Absolute: 0 K/uL (ref 0.0–0.5)
Eosinophils Relative: 1 %
HCT: 27.1 % — ABNORMAL LOW (ref 39.0–52.0)
Hemoglobin: 8.9 g/dL — ABNORMAL LOW (ref 13.0–17.0)
Immature Granulocytes: 0 %
Lymphocytes Relative: 18 %
Lymphs Abs: 0.4 K/uL — ABNORMAL LOW (ref 0.7–4.0)
MCH: 32.7 pg (ref 26.0–34.0)
MCHC: 32.8 g/dL (ref 30.0–36.0)
MCV: 99.6 fL (ref 80.0–100.0)
Monocytes Absolute: 0.3 K/uL (ref 0.1–1.0)
Monocytes Relative: 12 %
Neutro Abs: 1.5 K/uL — ABNORMAL LOW (ref 1.7–7.7)
Neutrophils Relative %: 68 %
Platelet Count: 144 K/uL — ABNORMAL LOW (ref 150–400)
RBC: 2.72 MIL/uL — ABNORMAL LOW (ref 4.22–5.81)
RDW: 15.9 % — ABNORMAL HIGH (ref 11.5–15.5)
WBC Count: 2.3 K/uL — ABNORMAL LOW (ref 4.0–10.5)
nRBC: 0 % (ref 0.0–0.2)

## 2024-07-31 LAB — SAMPLE TO BLOOD BANK

## 2024-07-31 MED ORDER — SODIUM CHLORIDE 0.9% FLUSH
10.0000 mL | Freq: Once | INTRAVENOUS | Status: AC
  Administered 2024-07-31: 10 mL

## 2024-08-01 LAB — PROSTATE-SPECIFIC AG, SERUM (LABCORP): Prostate Specific Ag, Serum: 1 ng/mL (ref 0.0–4.0)

## 2024-08-01 LAB — TESTOSTERONE: Testosterone: <3 ng/dL — ABNORMAL LOW (ref 264–916)

## 2024-08-04 DIAGNOSIS — E119 Type 2 diabetes mellitus without complications: Secondary | ICD-10-CM | POA: Diagnosis not present

## 2024-08-07 ENCOUNTER — Inpatient Hospital Stay (HOSPITAL_COMMUNITY): Admission: RE | Admit: 2024-08-07 | Source: Ambulatory Visit

## 2024-08-12 ENCOUNTER — Other Ambulatory Visit (HOSPITAL_COMMUNITY)

## 2024-08-20 NOTE — Written Directive (Cosign Needed)
  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

## 2024-08-21 ENCOUNTER — Ambulatory Visit (HOSPITAL_COMMUNITY)
Admission: RE | Admit: 2024-08-21 | Discharge: 2024-08-21 | Disposition: A | Discharge status: Home / Self Care | Source: Ambulatory Visit

## 2024-08-21 VITALS — BP 136/45 | HR 66 | Resp 14

## 2024-08-21 DIAGNOSIS — C61 Malignant neoplasm of prostate: Secondary | ICD-10-CM | POA: Insufficient documentation

## 2024-08-21 DIAGNOSIS — C799 Secondary malignant neoplasm of unspecified site: Secondary | ICD-10-CM | POA: Diagnosis present

## 2024-08-21 DIAGNOSIS — D649 Anemia, unspecified: Secondary | ICD-10-CM | POA: Diagnosis not present

## 2024-08-21 DIAGNOSIS — D72819 Decreased white blood cell count, unspecified: Secondary | ICD-10-CM | POA: Diagnosis not present

## 2024-08-21 DIAGNOSIS — N289 Disorder of kidney and ureter, unspecified: Secondary | ICD-10-CM | POA: Diagnosis not present

## 2024-08-21 LAB — CBC WITH DIFFERENTIAL/PLATELET
Abs Immature Granulocytes: 0.01 K/uL (ref 0.00–0.07)
Basophils Absolute: 0 K/uL (ref 0.0–0.1)
Basophils Relative: 0 %
Eosinophils Absolute: 0 K/uL (ref 0.0–0.5)
Eosinophils Relative: 1 %
HCT: 28.6 % — ABNORMAL LOW (ref 39.0–52.0)
Hemoglobin: 8.8 g/dL — ABNORMAL LOW (ref 13.0–17.0)
Immature Granulocytes: 0 %
Lymphocytes Relative: 16 %
Lymphs Abs: 0.4 K/uL — ABNORMAL LOW (ref 0.7–4.0)
MCH: 32.2 pg (ref 26.0–34.0)
MCHC: 30.8 g/dL (ref 30.0–36.0)
MCV: 104.8 fL — ABNORMAL HIGH (ref 80.0–100.0)
Monocytes Absolute: 0.2 K/uL (ref 0.1–1.0)
Monocytes Relative: 9 %
Neutro Abs: 1.9 K/uL (ref 1.7–7.7)
Neutrophils Relative %: 74 %
Platelets: 158 K/uL (ref 150–400)
RBC: 2.73 MIL/uL — ABNORMAL LOW (ref 4.22–5.81)
RDW: 15.9 % — ABNORMAL HIGH (ref 11.5–15.5)
WBC: 2.6 K/uL — ABNORMAL LOW (ref 4.0–10.5)
nRBC: 0 % (ref 0.0–0.2)

## 2024-08-21 LAB — BASIC METABOLIC PANEL WITH GFR
Anion gap: 12 (ref 5–15)
BUN: 32 mg/dL — ABNORMAL HIGH (ref 8–23)
CO2: 23 mmol/L (ref 22–32)
Calcium: 9.1 mg/dL (ref 8.9–10.3)
Chloride: 106 mmol/L (ref 98–111)
Creatinine, Ser: 1.64 mg/dL — ABNORMAL HIGH (ref 0.61–1.24)
GFR, Estimated: 41 mL/min — ABNORMAL LOW (ref >60)
Glucose, Bld: 116 mg/dL — ABNORMAL HIGH (ref 70–99)
Potassium: 4.1 mmol/L (ref 3.5–5.1)
Sodium: 141 mmol/L (ref 135–145)

## 2024-08-21 MED ORDER — SODIUM CHLORIDE 0.9 % IV BOLUS
1000.0000 mL | Freq: Once | INTRAVENOUS | Status: AC
  Administered 2024-08-21: 1000 mL via INTRAVENOUS

## 2024-08-21 MED ORDER — SODIUM CHLORIDE 0.9 % IV BOLUS: 1000.0000 mL | Freq: Once | INTRAVENOUS | Status: DC

## 2024-08-21 MED ORDER — LUTETIUM LU 177 VIPIVOTIDE TET 1000 MBQ/ML IV SOLN
195.6100 | Freq: Once | INTRAVENOUS | Status: AC
  Administered 2024-08-21: 195.61 via INTRAVENOUS

## 2024-08-21 NOTE — Progress Notes (Signed)
 CLINICAL DATA: [Castrate resistant metastatic prostate carcinoma.]  EXAM: NUCLEAR MEDICINE PLUVICTO  INJECTION  TECHNIQUE: Infusion: The nuclear medicine technologist and I personally verified the dose activity to be delivered as specified in the written directive, and verified the patient identification via 2 separate methods.  Initial flush of the intravenous catheter was performed was sterile saline. The dose syringe was connected to the catheter and the Lu-177 Pluvicto  administered over a 1 to 10 min infusion. Single 10 cc  lushes with normal saline follow the dose. No complications were noted. The entire IV tubing, venocatheter, stopcock and syringes was removed in total, placed in a disposal bag and sent for assay of the residual activity, which will be reported at a later time in our EMR by the physics staff. Pressure was applied to the venipuncture site, and a compression bandage placed. Patient monitored for 1 hour following infusion.    Radiation Safety personnel were present to perform the discharge survey, as detailed on their documentation. After a short period of observation, the patient had his IV removed.  RADIOPHARMACEUTICALS: [One hundred ninety-six] microcuries Lu-177 PLUVICTO   FINDINGS: Current Infusion: [ 5]  Planned Infusions: 6    Patient presented to nuclear medicine for treatment. The patient's most recent blood counts were reviewed and remains a good candidate to proceed with Lu-177 Pluvicto .      No reported adverse effects.  Some dry mouth reported by oncologist examination.  Patient encouraged to maintain hydration     PSA continues to decrease.  Current PSA equal 1.0.     mild renal insufficiency with creatinine equal 1.6.  Patient advised to hydrate.         Stable mild anemia with hemoglobin equal 8.8.  Stable leukopenia with white blood cell count equal 2.6     Platelets improved to 158 K       The patient was situated in an  infusion suite with a contact barrier placed under the arm. Intravenous access was established, using sterile technique, and a normal saline infusion from a syringe was started.     Micro-dosimetry: The prescribed radiation activity was assayed and confirmed to be within specified tolerance.  IMPRESSION: Current Infusion: 5  Planned Infusions: 6    [The patient tolerated the infusion well. The patient will return in one month for ongoing care.]

## 2024-09-02 DIAGNOSIS — Z79899 Other long term (current) drug therapy: Secondary | ICD-10-CM | POA: Diagnosis not present

## 2024-09-02 DIAGNOSIS — Z23 Encounter for immunization: Secondary | ICD-10-CM | POA: Diagnosis not present

## 2024-09-02 DIAGNOSIS — E119 Type 2 diabetes mellitus without complications: Secondary | ICD-10-CM | POA: Diagnosis not present

## 2024-09-02 DIAGNOSIS — M1990 Unspecified osteoarthritis, unspecified site: Secondary | ICD-10-CM | POA: Diagnosis not present

## 2024-09-02 DIAGNOSIS — I119 Hypertensive heart disease without heart failure: Secondary | ICD-10-CM | POA: Diagnosis not present

## 2024-09-03 DIAGNOSIS — E119 Type 2 diabetes mellitus without complications: Secondary | ICD-10-CM | POA: Diagnosis not present

## 2024-09-15 ENCOUNTER — Other Ambulatory Visit: Payer: Self-pay

## 2024-09-15 DIAGNOSIS — C61 Malignant neoplasm of prostate: Secondary | ICD-10-CM

## 2024-09-15 DIAGNOSIS — D518 Other vitamin B12 deficiency anemias: Secondary | ICD-10-CM

## 2024-09-15 NOTE — Assessment & Plan Note (Addendum)
 Pluvicto   3/25 C1.  5/6 C2 6/17 C3 7/29 C4 9/18 C5 Will continue Pluvicto  on 9/4, 10/21 Consider adding Provenge Next appointment [facility] will be Oct for his next ADT with 6 mo dosage. Follow up before each Pluvicto  for CBC, T&S and transfusion if needed.

## 2024-09-15 NOTE — Progress Notes (Unsigned)
 [facility] OFFICE PROGRESS NOTE  Patient Care Team: Hillman Bare, MD as PCP - General (Pulmonary Disease) Claudene Victory ORN, MD (Inactive) as PCP - Cardiology (Cardiology) Vertell Pont, RN as Oncology Nurse Navigator  Ian Dudley is a 83 y.o.male with history of HTN, HLD, being follow [facility] for recurrent prostate cancer. 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 He had rising PSA and enrolled into [facility] in 2021. PSMA PET in September 2024 showed lymphadenopathy above and below the diaphragm. Repeat staging in 12/2023 showed progression metastatic adenopathy. He started docetaxel  on 01/14/24 at reduced dose due to his age.    Negative genetic testing on the CancerNext+RNAinsight panel. RAD51D p.R165W (c.493C>T) VUS identified.    Current diagnosis: mCRPC Initial diagnosis: 10/15/2007.  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 Extensive perineural invasion. PSA in 12/2007 was up to 21.  Progression: PSA 0.1 in 12/2008 and 0.2 in 07/2009.  Germline Ambry testing negative. VUS RAD51D Somatic testing: TMB low. pMMR/MSI stable. PDL1 negative. HRD negative. Previous treatment: prostatectomy, salvage radiation, ADT/ARPI docetaxel . 02/05/24 completed cycle 2 with cytopenia. Switched to Pluvicto   Current treatment: Pluvicto .  Started on 02/26/2024  Last treatment will be 10/21.  If slow rising PSA, will consider Provenge.  Will continue to monitor about monthly. 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  5/6 C2 6/17 C3 7/29 C4 9/18 C5 Will continue Pluvicto  on 9/4, 10/21 Consider adding Provenge Next appointment [facility] will be Oct for his next ADT with 6 mo  dosage. Follow up before each Pluvicto  for CBC, T&S and transfusion if needed. Other vitamin B12 deficiency anemia Continue B12 1000 mcg daily Pending results today At risk for side effect of medication Dexa scan about 2 weeks after next Pluvicto  Continue vit D and calcium Continue exercises and walking as able. Continue dental care. He will let dentist know if ok with zometa or xgeva Aggressive CV risk management.  Orders Placed This Encounter  Procedures   CBC with Differential (Cancer Center Only)    Standing Status:   Future    Expiration Date:   09/16/2025   CMP (Cancer Center only)    Standing Status:   Future    Expiration Date:   09/16/2025   PSA    Standing Status:   Future    Expiration Date:   09/16/2025   Testosterone     Standing Status:   Future    Expiration Date:   09/16/2025     Ian JAYSON Chihuahua, MD  INTERVAL HISTORY: Patient returns for follow-up.  Overall feeling well.  Denies much side effects from his treatment.  No new bone pain or back pain, intermittent lower right sided pain.  No trouble urinating.  Appetite is good.  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 Extensive perineural invasion.   12/2008 Tumor Marker   PSA 0.1   07/2009 Tumor Marker   PSA 0.2   11/2021 Tumor Marker   PSA 4.26   03/2022 Tumor Marker   PSA 12.2   03/2022 -  Chemotherapy   Started Xtandi Report he could not take continuously due to cost.   12/2022 Tumor Marker   PSA 26   03/2023 Tumor Marker   PSA 33   07/2023 Tumor Marker   PSA 73.3   07/23/2023 Imaging   CT AP new pathologic retrocrural and para-aortic adenopathy, with enlarging lower thoracic para-aortic adenopathy; no definite pathologic adenopathy  in pelvis.      08/15/2023 Procedure   A. LYMPH NODE, LEFT SIDED RETROPERITONEAL NODAL MASS, NEEDLE CORE BIOPSY:      Metastatic carcinoma, consistent with prostate primary.       See comment.   COMMENT:   Immunohistochemical stains for NKX3.1 and prostein were performed and  are positive in the tumor cells. The findings are supportive of  metastatic carcinoma with prostate primary.   Controls worked appropriately.    08/21/2023 PET scan   PSMA PET IMPRESSION: Prior prostatectomy. No evidence of local tumor recurrence.   Metastatic lymphadenopathy in the left lower neck, mediastinum, bilateral retrocrural regions, and abdominal retroperitoneum in left para-aortic region.   No evidence of pelvic metastatic lymphadenopathy or skeletal metastases.   Emphysema (ICD10-J43.9).   08/2023 Miscellaneous   Consultation with radiation oncology.  PSMA PET performed.  Radiation treatment is not a suitable option due to the extent of the disease.    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  RAD51D  p.R165W (c.493C>T) VUS identified.  The report date is January 01, 2024.  The [facility] CancerNext+RNAinsight Panel includes sequencing, rearrangement analysis, and RNA analysis for the following 39 genes: APC, ATM, BAP1, BARD1, BMPR1A, BRCA1, BRCA2, BRIP1, CDH1, CDKN2A, CHEK2, FH, FLCN, MET, MLH1, MSH2, MSH6, MUTYH, NF1, NTHL1, PALB2, PMS2, PTEN, RAD51C, RAD51D, SMAD4, STK11, TP53, TSC1, TSC2, and VHL (sequencing and deletion/duplication); AXIN2, HOXB13, MBD4, MSH3, POLD1 and POLE (sequencing only); EPCAM and GREM1 (deletion/duplication only).    01/14/2024 - 02/05/2024 Chemotherapy   Patient is on Treatment Plan : PROSTATE Docetaxel  (74) + Prednisone  q14d        PHYSICAL EXAMINATION: ECOG  PERFORMANCE STATUS: 1 - Symptomatic but completely ambulatory  Vitals:   09/16/24 1404  BP: (!) 143/53  Pulse: 73  Resp: 18  Temp: 97.8 F (36.6 C)  SpO2: 100%   Filed Weights   09/16/24 1404  Weight: 204 lb 8 oz (92.8 kg)    GENERAL: alert, no distress and comfortable LUNGS: clear to auscultation and no wheeze or rales with normal breathing effort HEART: regular rate & rhythm  ABDOMEN: abdomen soft, non-tender and nondistended. Musculoskeletal: no point tenderness  Relevant data reviewed during this visit included labs.  New labs ordered.

## 2024-09-16 ENCOUNTER — Inpatient Hospital Stay

## 2024-09-16 ENCOUNTER — Inpatient Hospital Stay (HOSPITAL_BASED_OUTPATIENT_CLINIC_OR_DEPARTMENT_OTHER)

## 2024-09-16 VITALS — BP 143/53 | HR 73 | Temp 97.8°F | Resp 18 | Ht 67.5 in | Wt 204.5 lb

## 2024-09-16 DIAGNOSIS — C772 Secondary and unspecified malignant neoplasm of intra-abdominal lymph nodes: Secondary | ICD-10-CM | POA: Diagnosis not present

## 2024-09-16 DIAGNOSIS — Z79899 Other long term (current) drug therapy: Secondary | ICD-10-CM | POA: Insufficient documentation

## 2024-09-16 DIAGNOSIS — C61 Malignant neoplasm of prostate: Secondary | ICD-10-CM

## 2024-09-16 DIAGNOSIS — Z9189 Other specified personal risk factors, not elsewhere classified: Secondary | ICD-10-CM

## 2024-09-16 DIAGNOSIS — C799 Secondary malignant neoplasm of unspecified site: Secondary | ICD-10-CM

## 2024-09-16 DIAGNOSIS — D518 Other vitamin B12 deficiency anemias: Secondary | ICD-10-CM

## 2024-09-16 DIAGNOSIS — Z9079 Acquired absence of other genital organ(s): Secondary | ICD-10-CM | POA: Insufficient documentation

## 2024-09-16 DIAGNOSIS — D519 Vitamin B12 deficiency anemia, unspecified: Secondary | ICD-10-CM | POA: Insufficient documentation

## 2024-09-16 LAB — CBC WITH DIFFERENTIAL (CANCER CENTER ONLY)
Abs Immature Granulocytes: 0.01 K/uL (ref 0.00–0.07)
Basophils Absolute: 0 K/uL (ref 0.0–0.1)
Basophils Relative: 0 %
Eosinophils Absolute: 0 K/uL (ref 0.0–0.5)
Eosinophils Relative: 1 %
HCT: 26.7 % — ABNORMAL LOW (ref 39.0–52.0)
Hemoglobin: 8.8 g/dL — ABNORMAL LOW (ref 13.0–17.0)
Immature Granulocytes: 0 %
Lymphocytes Relative: 9 %
Lymphs Abs: 0.3 K/uL — ABNORMAL LOW (ref 0.7–4.0)
MCH: 33.3 pg (ref 26.0–34.0)
MCHC: 33 g/dL (ref 30.0–36.0)
MCV: 101.1 fL — ABNORMAL HIGH (ref 80.0–100.0)
Monocytes Absolute: 0.5 K/uL (ref 0.1–1.0)
Monocytes Relative: 14 %
Neutro Abs: 2.4 K/uL (ref 1.7–7.7)
Neutrophils Relative %: 76 %
Platelet Count: 119 K/uL — ABNORMAL LOW (ref 150–400)
RBC: 2.64 MIL/uL — ABNORMAL LOW (ref 4.22–5.81)
RDW: 15 % (ref 11.5–15.5)
WBC Count: 3.1 K/uL — ABNORMAL LOW (ref 4.0–10.5)
nRBC: 0 % (ref 0.0–0.2)

## 2024-09-16 LAB — CMP (CANCER CENTER ONLY)
ALT: 6 U/L (ref 0–44)
AST: 14 U/L — ABNORMAL LOW (ref 15–41)
Albumin: 3.5 g/dL (ref 3.5–5.0)
Alkaline Phosphatase: 50 U/L (ref 38–126)
Anion gap: 5 (ref 5–15)
BUN: 34 mg/dL — ABNORMAL HIGH (ref 8–23)
CO2: 29 mmol/L (ref 22–32)
Calcium: 9.2 mg/dL (ref 8.9–10.3)
Chloride: 107 mmol/L (ref 98–111)
Creatinine: 1.65 mg/dL — ABNORMAL HIGH (ref 0.61–1.24)
GFR, Estimated: 41 mL/min — ABNORMAL LOW (ref >60)
Glucose, Bld: 89 mg/dL (ref 70–99)
Potassium: 4.5 mmol/L (ref 3.5–5.1)
Sodium: 141 mmol/L (ref 135–145)
Total Bilirubin: 0.3 mg/dL (ref 0.0–1.2)
Total Protein: 6.5 g/dL (ref 6.5–8.1)

## 2024-09-16 LAB — VITAMIN B12: Vitamin B-12: 379 pg/mL (ref 180–914)

## 2024-09-16 LAB — PSA: Prostatic Specific Antigen: 0.55 ng/mL (ref 0.00–4.00)

## 2024-09-16 NOTE — Assessment & Plan Note (Addendum)
 Dexa scan about 2 weeks after next Pluvicto  Continue vit D and calcium Continue exercises and walking as able. Continue dental care. He will let dentist know if ok with zometa or xgeva Aggressive CV risk management.

## 2024-09-16 NOTE — Assessment & Plan Note (Addendum)
 Continue B12 1000 mcg daily Pending results today

## 2024-09-17 LAB — TESTOSTERONE: Testosterone: 6 ng/dL — ABNORMAL LOW (ref 264–916)

## 2024-09-18 ENCOUNTER — Ambulatory Visit: Payer: Self-pay

## 2024-09-18 DIAGNOSIS — C61 Malignant neoplasm of prostate: Secondary | ICD-10-CM | POA: Diagnosis not present

## 2024-09-18 DIAGNOSIS — C7951 Secondary malignant neoplasm of bone: Secondary | ICD-10-CM | POA: Diagnosis not present

## 2024-09-18 DIAGNOSIS — C775 Secondary and unspecified malignant neoplasm of intrapelvic lymph nodes: Secondary | ICD-10-CM | POA: Diagnosis not present

## 2024-09-18 DIAGNOSIS — N393 Stress incontinence (female) (male): Secondary | ICD-10-CM | POA: Diagnosis not present

## 2024-09-18 NOTE — Telephone Encounter (Signed)
-----   Message from [name] sent at 09/18/2024  8:29 AM EDT ----- Zorita would you make sure he is taking b12. If he is taking 1000 mcg daily, will increase to 1000 mcg twice daily. Thanks.  ----- Message ----- From: Rebecka, Lab In [place] Sent: 09/16/2024   2:03 PM EDT To: [name], MD

## 2024-09-18 NOTE — Telephone Encounter (Signed)
 Ian Dudley confirmed that he is taking B 12 daily. Will increase to twice per day.

## 2024-09-23 ENCOUNTER — Ambulatory Visit (HOSPITAL_COMMUNITY)
Admission: RE | Admit: 2024-09-23 | Discharge: 2024-09-23 | Disposition: A | Discharge status: Home / Self Care | Source: Ambulatory Visit

## 2024-09-23 DIAGNOSIS — D72829 Elevated white blood cell count, unspecified: Secondary | ICD-10-CM | POA: Diagnosis not present

## 2024-09-23 DIAGNOSIS — D649 Anemia, unspecified: Secondary | ICD-10-CM | POA: Diagnosis not present

## 2024-09-23 DIAGNOSIS — C61 Malignant neoplasm of prostate: Secondary | ICD-10-CM | POA: Insufficient documentation

## 2024-09-23 DIAGNOSIS — R519 Headache, unspecified: Secondary | ICD-10-CM | POA: Diagnosis not present

## 2024-09-23 DIAGNOSIS — C799 Secondary malignant neoplasm of unspecified site: Secondary | ICD-10-CM | POA: Diagnosis present

## 2024-09-23 MED ORDER — LUTETIUM LU 177 VIPIVOTIDE TET 1000 MBQ/ML IV SOLN
207.9300 | Freq: Once | INTRAVENOUS | Status: AC
  Administered 2024-09-23: 207.93 via INTRAVENOUS

## 2024-09-23 MED ORDER — SODIUM CHLORIDE 0.9 % IV BOLUS: 1000.0000 mL | Freq: Once | INTRAVENOUS | Status: DC

## 2024-09-23 MED ORDER — SODIUM CHLORIDE 0.9 % IV BOLUS
1000.0000 mL | Freq: Once | INTRAVENOUS | Status: AC
  Administered 2024-09-23: 1000 mL via INTRAVENOUS

## 2024-09-23 NOTE — Progress Notes (Signed)
 EXAM: NUCLEAR MEDICINE PLUVICTO  INJECTION 09/23/2024 02:13:03 PM TECHNIQUE: Patient presented to nuclear medicine for treatment of metastatic prostate cancer. PSMA PET scan was reviewed. The patient's most recent labs were reviewed and it was determined to proceed with Lu-177 Pluvicto . Written informed consent was obtained from the patient. Written instructions for radiation safety were provided. The radiopharmaceutical, 207.93 mCi Lu-177 Pluvicto , was injected intravenously via RAC IV at 12:35 PM over 2 minutes according to local protocol. Initial assay was 208 mCi, and residual assay was 0.07 mCi. Dr. Malva signed the written directive. The patient was given copies of all forms along with a travel card. The patient was surveyed at 3 ft measuring approximately 2.1 mGy/hr and released to the general public. RADIOPHARMACEUTICAL: 207.93 mCi Lu-177 PLUVICTO  COMPARISON: None available. CLINICAL HISTORY: metastasis from hormone-refractory prostate cancer. FINDINGS: Current Infusion: This is the 6th and final treatment of the planned 6 infusions. Patient reports no adverse effects. Patient has a mild headache today. Increased white blood cell count to 3.1. Stable anemia at 8.3. Creatinine remains mildly elevated at 1.65. PSA continues to decline, equal to 0.55. Planned Infusions: 6 IMPRESSION: Treatment 6 of 6 planned Lu-177 Pluvicto  for metastatic castration-resistant prostate cancer. The patient tolerated the infusion without adverse effects. PSA continues to decline to 0.55, consistent with ongoing biochemical response.

## 2024-09-23 NOTE — Written Directive (Addendum)
  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  AUTHORIZED USER SIGNATURE & TIME STAMP: Norleen GORMAN Boxer, MD   09/23/24    8:25 AM

## 2024-10-04 DIAGNOSIS — E119 Type 2 diabetes mellitus without complications: Secondary | ICD-10-CM | POA: Diagnosis not present

## 2024-10-23 ENCOUNTER — Inpatient Hospital Stay

## 2024-10-23 DIAGNOSIS — C61 Malignant neoplasm of prostate: Secondary | ICD-10-CM | POA: Diagnosis not present

## 2024-10-23 DIAGNOSIS — N1832 Chronic kidney disease, stage 3b: Secondary | ICD-10-CM | POA: Insufficient documentation

## 2024-10-23 DIAGNOSIS — D519 Vitamin B12 deficiency anemia, unspecified: Secondary | ICD-10-CM | POA: Insufficient documentation

## 2024-10-23 DIAGNOSIS — C799 Secondary malignant neoplasm of unspecified site: Secondary | ICD-10-CM | POA: Insufficient documentation

## 2024-10-23 DIAGNOSIS — Z9079 Acquired absence of other genital organ(s): Secondary | ICD-10-CM | POA: Diagnosis not present

## 2024-10-23 DIAGNOSIS — Z79899 Other long term (current) drug therapy: Secondary | ICD-10-CM | POA: Insufficient documentation

## 2024-10-23 DIAGNOSIS — M1990 Unspecified osteoarthritis, unspecified site: Secondary | ICD-10-CM | POA: Diagnosis not present

## 2024-10-23 LAB — CBC WITH DIFFERENTIAL (CANCER CENTER ONLY)
Abs Immature Granulocytes: 0.01 K/uL (ref 0.00–0.07)
Basophils Absolute: 0 K/uL (ref 0.0–0.1)
Basophils Relative: 0 %
Eosinophils Absolute: 0 K/uL (ref 0.0–0.5)
Eosinophils Relative: 1 %
HCT: 27.2 % — ABNORMAL LOW (ref 39.0–52.0)
Hemoglobin: 8.8 g/dL — ABNORMAL LOW (ref 13.0–17.0)
Immature Granulocytes: 0 %
Lymphocytes Relative: 15 %
Lymphs Abs: 0.4 K/uL — ABNORMAL LOW (ref 0.7–4.0)
MCH: 33.5 pg (ref 26.0–34.0)
MCHC: 32.4 g/dL (ref 30.0–36.0)
MCV: 103.4 fL — ABNORMAL HIGH (ref 80.0–100.0)
Monocytes Absolute: 0.3 K/uL (ref 0.1–1.0)
Monocytes Relative: 14 %
Neutro Abs: 1.7 K/uL (ref 1.7–7.7)
Neutrophils Relative %: 70 %
Platelet Count: 102 K/uL — ABNORMAL LOW (ref 150–400)
RBC: 2.63 MIL/uL — ABNORMAL LOW (ref 4.22–5.81)
RDW: 15.9 % — ABNORMAL HIGH (ref 11.5–15.5)
WBC Count: 2.5 K/uL — ABNORMAL LOW (ref 4.0–10.5)
nRBC: 0 % (ref 0.0–0.2)

## 2024-10-23 LAB — CMP (CANCER CENTER ONLY)
ALT: 7 U/L (ref 0–44)
AST: 22 U/L (ref 15–41)
Albumin: 3.8 g/dL (ref 3.5–5.0)
Alkaline Phosphatase: 61 U/L (ref 38–126)
Anion gap: 11 (ref 5–15)
BUN: 40 mg/dL — ABNORMAL HIGH (ref 8–23)
CO2: 25 mmol/L (ref 22–32)
Calcium: 9.4 mg/dL (ref 8.9–10.3)
Chloride: 106 mmol/L (ref 98–111)
Creatinine: 1.59 mg/dL — ABNORMAL HIGH (ref 0.61–1.24)
GFR, Estimated: 43 mL/min — ABNORMAL LOW (ref >60)
Glucose, Bld: 93 mg/dL (ref 70–99)
Potassium: 4.8 mmol/L (ref 3.5–5.1)
Sodium: 141 mmol/L (ref 135–145)
Total Bilirubin: 0.3 mg/dL (ref 0.0–1.2)
Total Protein: 6.6 g/dL (ref 6.5–8.1)

## 2024-10-23 LAB — PSA: Prostatic Specific Antigen: 0.25 ng/mL (ref 0.00–4.00)

## 2024-10-23 NOTE — Progress Notes (Signed)
 [facility] OFFICE PROGRESS NOTE  Patient Care Team: Ian Bare, MD as PCP - General (Pulmonary Disease) Ian Dudley ORN, MD (Inactive) as PCP - Cardiology (Cardiology) Ian Pont, RN as Oncology Nurse Navigator  Ian is a 83 y.o.male with history of HTN, HLD, being [facility] cancer. 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 He had rising PSA and enrolled into [facility] in 2021. PSMA PET in September 2024 showed lymphadenopathy above and below the diaphragm. Repeat staging in 12/2023 showed progression metastatic adenopathy. He started docetaxel  on 01/14/24 at reduced dose due to his age.   Negative genetic testing on the CancerNext+RNAinsight panel. RAD51D p.R165W (c.493C>T) VUS identified.   Current diagnosis: mCRPC Initial diagnosis: 10/15/2007.  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 Extensive perineural invasion. PSA in 12/2007 was up to 21.  Progression: PSA 0.1 in 12/2008 and 0.2 in 07/2009.  Germline [facility]ry testing negative. VUS RAD51D Somatic testing: TMB low. pMMR/MSI stable. PDL1 negative. HRD negative. Previous treatment: prostatectomy, salvage radiation, ADT/ARPI docetaxel . 02/05/24 completed cycle 2 with cytopenia. Switched to Pluvicto  10/21 completed Pluvicto   PSA still detectable.  Discussed Provenge.  Response may in efficacy is higher when PSA is lower.  We did talk about port, side effects.  Potential infection, DVT, flulike symptoms, nausea, headaches, fatigue and bodyaches. 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  5/6 C2 6/17 C3 7/29 C4 9/18 C5 10/21 C6 Recommed adding Provenge [facility] appointment  [facility] will be April for his next ADT with 6 mo dosage. Follow up in about 6 weeks with CBC, T&S and transfusion if needed. Stage 3b chronic kidney disease (HCC) Recommend him to increase hydration 60-70 oz per day Other vitamin B12 deficiency anemia Continue B12 1000 mcg daily Repeat in 6 weeks At risk for side effect of medication Dexa scan about 2 weeks after next Pluvicto  Continue vit D and calcium Continue exercises and walking as able. Continue dental care. He will let dentist know if ok with zometa or xgeva Aggressive CV risk management.  Orders Placed This Encounter  Procedures   CBC with Differential (Cancer Center Only)    Standing Status:   Future    Expiration Date:   10/24/2025   CMP (Cancer Center only)    Standing Status:   Future    Expiration Date:   10/24/2025   Testosterone     Standing Status:   Future    Expiration Date:   10/24/2025   PSA    Standing Status:   Future    Expiration Date:   10/24/2025   Vitamin B12    Standing Status:   Future    Expiration Date:   10/24/2025   Methylmalonic acid, serum    Standing Status:   Future    Expiration Date:   01/24/2025     Ian JAYSON Chihuahua, MD  INTERVAL HISTORY: Patient returns for follow-up. Pain over right lower back comes and goes about the same. No other new symptoms. No difficulty urinating. He can walk but arthritis is the limitation. No chest pain.  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 Extensive perineural invasion.   12/2008 Tumor Marker   PSA 0.1   07/2009 Tumor Marker   PSA 0.2   11/2021 Tumor Marker   PSA 4.26   03/2022 Tumor Marker   PSA 12.2   03/2022 -  Chemotherapy   Started Ian Dudley he could not take continuously due  to cost.   12/2022 Tumor Marker   PSA 26   03/2023 Tumor Marker   PSA 33   07/2023 Tumor Marker   PSA 73.3   07/23/2023 Imaging   CT AP new pathologic retrocrural and para-aortic adenopathy, with enlarging lower thoracic para-aortic adenopathy; no definite pathologic adenopathy in pelvis.      08/15/2023 Procedure   A. LYMPH NODE, LEFT SIDED RETROPERITONEAL NODAL MASS, NEEDLE CORE BIOPSY:      Metastatic carcinoma, consistent with prostate primary.       See comment.   COMMENT:   Immunohistochemical stains for NKX3.1 and prostein were performed and  are positive in the tumor cells. The findings are supportive of  metastatic carcinoma with prostate primary.   Controls worked appropriately.    08/21/2023 PET scan   PSMA PET IMPRESSION: Prior prostatectomy. No evidence of local tumor recurrence.   Metastatic lymphadenopathy in the left lower neck, mediastinum, bilateral retrocrural regions, and abdominal retroperitoneum in left para-aortic region.   No evidence of pelvic metastatic lymphadenopathy or skeletal metastases.   Emphysema (ICD10-J43.9).   08/2023 Miscellaneous   Consultation with radiation oncology.  PSMA PET performed.  Radiation treatment is not a suitable option due to the extent of the disease.    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  RAD51D  p.R165W (c.493C>T) VUS identified.  The Dudley date is January 01, 2024.  The [facility] CancerNext+RNAinsight Panel includes sequencing, rearrangement analysis, and RNA analysis for the following 39 genes: APC, ATM, BAP1, BARD1, BMPR1A, BRCA1, BRCA2, BRIP1, CDH1, CDKN2A, CHEK2, FH, FLCN, MET, MLH1, MSH2, MSH6, MUTYH, NF1, NTHL1, PALB2, PMS2, PTEN, RAD51C, RAD51D, SMAD4, STK11, TP53, TSC1, TSC2, and VHL (sequencing  and deletion/duplication); AXIN2, HOXB13, MBD4, MSH3, POLD1 and POLE (sequencing only); EPCAM and GREM1 (deletion/duplication only).    01/14/2024 - 02/05/2024 Chemotherapy   Patient is on Treatment Plan : PROSTATE Docetaxel  (74) + Prednisone  q14d        PHYSICAL EXAMINATION: ECOG PERFORMANCE STATUS: 1  Vitals:   10/24/24 1400 10/24/24 1403  BP: (!) 138/41 (!) 137/43  Pulse: 64   Resp: 18   Temp: (!) 97.2 F (36.2 C)   SpO2: 98%    Filed Weights   10/24/24 1400  Weight: 201 lb (91.2 kg)    GENERAL: alert, no distress and comfortable LUNGS: clear to auscultation and no wheeze or rales with normal breathing effort HEART: regular rate & rhythm    Relevant data reviewed during this visit included labs.  New labs ordered.

## 2024-10-23 NOTE — Assessment & Plan Note (Addendum)
 Pluvicto   3/25 C1.  5/6 C2 6/17 C3 7/29 C4 9/18 C5 10/21 C6 Recommed adding Provenge [facility] appointment [facility] will be April for his next ADT with 6 mo dosage. Follow up in about 6 weeks with CBC, T&S and transfusion if needed.

## 2024-10-23 NOTE — Assessment & Plan Note (Addendum)
 Recommend him to increase hydration 60-70 oz per day

## 2024-10-23 NOTE — Assessment & Plan Note (Addendum)
 Continue B12 1000 mcg daily Repeat in 6 weeks

## 2024-10-24 ENCOUNTER — Inpatient Hospital Stay

## 2024-10-24 VITALS — BP 137/43 | HR 64 | Temp 97.2°F | Resp 18 | Wt 201.0 lb

## 2024-10-24 DIAGNOSIS — C799 Secondary malignant neoplasm of unspecified site: Secondary | ICD-10-CM

## 2024-10-24 DIAGNOSIS — N1832 Chronic kidney disease, stage 3b: Secondary | ICD-10-CM | POA: Diagnosis not present

## 2024-10-24 DIAGNOSIS — D518 Other vitamin B12 deficiency anemias: Secondary | ICD-10-CM | POA: Diagnosis not present

## 2024-10-24 DIAGNOSIS — C61 Malignant neoplasm of prostate: Secondary | ICD-10-CM | POA: Diagnosis not present

## 2024-10-24 DIAGNOSIS — Z79899 Other long term (current) drug therapy: Secondary | ICD-10-CM | POA: Diagnosis not present

## 2024-10-24 DIAGNOSIS — Z9189 Other specified personal risk factors, not elsewhere classified: Secondary | ICD-10-CM

## 2024-10-24 DIAGNOSIS — D519 Vitamin B12 deficiency anemia, unspecified: Secondary | ICD-10-CM | POA: Diagnosis not present

## 2024-10-24 DIAGNOSIS — Z9079 Acquired absence of other genital organ(s): Secondary | ICD-10-CM | POA: Diagnosis not present

## 2024-10-24 DIAGNOSIS — M1990 Unspecified osteoarthritis, unspecified site: Secondary | ICD-10-CM | POA: Diagnosis not present

## 2024-10-24 LAB — TESTOSTERONE: Testosterone: 4 ng/dL — ABNORMAL LOW (ref 264–916)

## 2024-10-24 NOTE — Assessment & Plan Note (Addendum)
 Dexa scan about 2 weeks after next Pluvicto  Continue vit D and calcium Continue exercises and walking as able. Continue dental care. He will let dentist know if ok with zometa or xgeva Aggressive CV risk management.

## 2024-11-06 ENCOUNTER — Ambulatory Visit (HOSPITAL_BASED_OUTPATIENT_CLINIC_OR_DEPARTMENT_OTHER): Admission: RE | Admit: 2024-11-06 | Discharge: 2024-11-06 | Disposition: A | Source: Ambulatory Visit

## 2024-11-06 DIAGNOSIS — C799 Secondary malignant neoplasm of unspecified site: Secondary | ICD-10-CM | POA: Diagnosis not present

## 2024-11-06 DIAGNOSIS — M85851 Other specified disorders of bone density and structure, right thigh: Secondary | ICD-10-CM | POA: Diagnosis not present

## 2024-11-06 DIAGNOSIS — Z9189 Other specified personal risk factors, not elsewhere classified: Secondary | ICD-10-CM | POA: Insufficient documentation

## 2024-11-06 DIAGNOSIS — M85852 Other specified disorders of bone density and structure, left thigh: Secondary | ICD-10-CM | POA: Diagnosis not present

## 2024-11-06 DIAGNOSIS — C61 Malignant neoplasm of prostate: Secondary | ICD-10-CM | POA: Insufficient documentation

## 2024-11-06 DIAGNOSIS — M8589 Other specified disorders of bone density and structure, multiple sites: Secondary | ICD-10-CM | POA: Diagnosis not present

## 2024-12-11 DIAGNOSIS — D61818 Other pancytopenia: Secondary | ICD-10-CM | POA: Insufficient documentation

## 2024-12-11 DIAGNOSIS — M8589 Other specified disorders of bone density and structure, multiple sites: Secondary | ICD-10-CM | POA: Insufficient documentation

## 2024-12-11 NOTE — Assessment & Plan Note (Addendum)
 Recommed adding Provenge Next appointment [facility] will be April for his next ADT with 6 mo dosage. Follow up in about 6 weeks with CBC, T&S and transfusion if needed. PSMA PET and follow up in ~3 weeks to go over results

## 2024-12-11 NOTE — Assessment & Plan Note (Addendum)
 Recommend start Zometa twice yearly

## 2024-12-11 NOTE — Assessment & Plan Note (Addendum)
 Continue vit D and calcium Continue exercises and walking as able. Continue dental care.  Will start zometa twice a year Aggressive CV risk management.

## 2024-12-11 NOTE — Assessment & Plan Note (Addendum)
 Repeat labs today showed worsening pancytopenia Repeat B12 and folate.

## 2024-12-11 NOTE — Progress Notes (Signed)
 [facility] OFFICE PROGRESS NOTE  Patient Care Team: Hillman Bare, MD as PCP - General (Pulmonary Disease) Claudene Victory ORN, MD (Inactive) as PCP - Cardiology (Cardiology) Vertell Pont, RN as Oncology Nurse Navigator  Ian Dudley is a 84 y.o.male with history of HTN, HLD, being [facility] for recurrent prostate cancer. 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 He had rising PSA and enrolled into [facility] in 2021. PSMA PET in September 2024 showed lymphadenopathy above and below the diaphragm. Repeat staging in 12/2023 showed progression metastatic adenopathy. He started docetaxel  on 01/14/24 at reduced dose due to his age.  Completed 2 cycles with continued rising PSA.  PSA peak at 240 on 02/18/2024.  Patient switched to Pluvicto  and completed 6 cycles as of October 21.  PSA nadir at 0.25 on 10/23/2024.   Negative genetic testing on the CancerNext+RNAinsight panel. RAD51D p.R165W (c.493C>T) VUS identified.   Current diagnosis: mCRPC Initial diagnosis: 10/15/2007.  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 Extensive perineural invasion. PSA in 12/2007 was up to 21.  Progression: PSA 0.1 in 12/2008 and 0.2 in 07/2009.  Germline Ambry testing negative. VUS RAD51D Somatic testing: TMB low. pMMR/MSI stable. PDL1 negative. HRD negative. Previous treatment: prostatectomy, salvage radiation, ADT/ARPI docetaxel . 02/05/24 completed cycle 2 with cytopenia. Switched to Pluvicto  10/21 completed Pluvicto   Bone density last month showed osteopenia.  Right total hip T-score -1.5.  Right femoral neck T-score -1.1.  Left total hip T-score -1.3.  Left femoral neck T-score -1.1.  He reports all dental procedures are completed.    Pancytopenia not improving. Will follow up PSA, b12 and folate.  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 Follow up in about 6 weeks with CBC, T&S and transfusion if needed. PSMA PET and follow up in ~3 weeks to go over results At risk for side effect of medication Continue vit D and calcium Continue exercises and walking as able. Continue dental care.  Will start zometa twice a year Aggressive CV risk management. Osteopenia of multiple sites Recommend start Zometa twice yearly Other pancytopenia (HCC) Repeat labs today showed worsening pancytopenia Repeat B12 and folate.  Orders Placed This Encounter  Procedures   NM PET (PSMA) SKULL TO MID THIGH    Standing Status:   Future    Expected Date:   12/26/2024    Expiration Date:   12/12/2025    If indicated for the ordered procedure, I authorize the administration of a radiopharmaceutical per Radiology protocol:   Yes    Preferred imaging location?:   [facility] Darra Pauletta JAYSON Tina, MD  INTERVAL HISTORY: Patient returns for follow-up. He denies chest pain, pressure, dizziness. Energy and appetite are the same. Chronic joint pain. No difficulty urinating. No new bone or back pain. No bleeding, bloody stool, stomach pain.  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 Extensive perineural invasion.   12/2008 Tumor Marker   PSA 0.1  07/2009 Tumor Marker   PSA 0.2   11/2021 Tumor Marker   PSA 4.26   03/2022 Tumor Marker   PSA 12.2   03/2022 -  Chemotherapy   Started Geni Report he could not take continuously due to cost.   12/2022 Tumor  Marker   PSA 26   03/2023 Tumor Marker   PSA 33   07/2023 Tumor Marker   PSA 73.3   07/23/2023 Imaging   CT AP new pathologic retrocrural and para-aortic adenopathy, with enlarging lower thoracic para-aortic adenopathy; no definite pathologic adenopathy in pelvis.      08/15/2023 Procedure   A. LYMPH NODE, LEFT SIDED RETROPERITONEAL NODAL MASS, NEEDLE CORE BIOPSY:      Metastatic carcinoma, consistent with prostate primary.       See comment.   COMMENT:   Immunohistochemical stains for NKX3.1 and prostein were performed and  are positive in the tumor cells. The findings are supportive of  metastatic carcinoma with prostate primary.   Controls worked appropriately.    08/21/2023 PET scan   PSMA PET IMPRESSION: Prior prostatectomy. No evidence of local tumor recurrence.   Metastatic lymphadenopathy in the left lower neck, mediastinum, bilateral retrocrural regions, and abdominal retroperitoneum in left para-aortic region.   No evidence of pelvic metastatic lymphadenopathy or skeletal metastases.   Emphysema (ICD10-J43.9).   08/2023 Miscellaneous   Consultation with radiation oncology.  PSMA PET performed.  Radiation treatment is not a suitable option due to the extent of the disease.    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  RAD51D  p.R165W (c.493C>T) VUS identified.  The report date is January 01, 2024.  The [facility] CancerNext+RNAinsight Panel includes sequencing, rearrangement analysis, and RNA analysis for the following 39 genes: APC, ATM, BAP1, BARD1, BMPR1A, BRCA1, BRCA2, BRIP1, CDH1, CDKN2A, CHEK2, FH, FLCN, MET, MLH1, MSH2, MSH6, MUTYH, NF1, NTHL1, PALB2, PMS2, PTEN, RAD51C, RAD51D, SMAD4, STK11, TP53, TSC1, TSC2, and VHL (sequencing and deletion/duplication);  AXIN2, HOXB13, MBD4, MSH3, POLD1 and POLE (sequencing only); EPCAM and GREM1 (deletion/duplication only).    01/14/2024 - 02/05/2024 Chemotherapy   Patient is on Treatment Plan : PROSTATE Docetaxel  (74) + Prednisone  q14d     02/26/2024 - 09/23/2024 Radiation Therapy   Pluvicto   3/25 C1. PSA 240 on 3/17 5/6 C2 6/17 C3 7/29 C4 9/18 C5 10/21 C6 10/23/24 PSA 0.25      PHYSICAL EXAMINATION: ECOG PERFORMANCE STATUS: 1  Vitals:   12/12/24 1110  BP: (!) 139/46  Pulse: 65  Resp: 20  Temp: (!) 97 F (36.1 C)  SpO2: 96%   Filed Weights   12/12/24 1110  Weight: 201 lb 8 oz (91.4 kg)    GENERAL: alert, no distress and comfortable SKIN: skin color normal  LYMPH:  no palpable cervical, axillary lymphadenopathy  LUNGS: clear to auscultation and no wheeze or rales with normal breathing effort HEART: regular rate & rhythm  ABDOMEN: abdomen soft, non-tender and nondistended. Musculoskeletal: bilateral trace lower ext edema NEURO: no focal motor/sensory deficits  Relevant data reviewed during this visit included labs.  New imaging ordered.

## 2024-12-12 ENCOUNTER — Telehealth: Payer: Self-pay

## 2024-12-12 ENCOUNTER — Other Ambulatory Visit: Payer: Self-pay

## 2024-12-12 ENCOUNTER — Inpatient Hospital Stay

## 2024-12-12 ENCOUNTER — Other Ambulatory Visit: Payer: Self-pay | Admitting: *Deleted

## 2024-12-12 VITALS — BP 139/46 | HR 65 | Temp 97.0°F | Resp 20 | Wt 201.5 lb

## 2024-12-12 DIAGNOSIS — Z9079 Acquired absence of other genital organ(s): Secondary | ICD-10-CM | POA: Insufficient documentation

## 2024-12-12 DIAGNOSIS — M8589 Other specified disorders of bone density and structure, multiple sites: Secondary | ICD-10-CM | POA: Insufficient documentation

## 2024-12-12 DIAGNOSIS — C61 Malignant neoplasm of prostate: Secondary | ICD-10-CM | POA: Diagnosis not present

## 2024-12-12 DIAGNOSIS — G8929 Other chronic pain: Secondary | ICD-10-CM | POA: Insufficient documentation

## 2024-12-12 DIAGNOSIS — Z79899 Other long term (current) drug therapy: Secondary | ICD-10-CM | POA: Insufficient documentation

## 2024-12-12 DIAGNOSIS — D61818 Other pancytopenia: Secondary | ICD-10-CM | POA: Insufficient documentation

## 2024-12-12 DIAGNOSIS — Z9189 Other specified personal risk factors, not elsewhere classified: Secondary | ICD-10-CM

## 2024-12-12 DIAGNOSIS — C799 Secondary malignant neoplasm of unspecified site: Secondary | ICD-10-CM | POA: Diagnosis not present

## 2024-12-12 DIAGNOSIS — D518 Other vitamin B12 deficiency anemias: Secondary | ICD-10-CM

## 2024-12-12 LAB — CBC WITH DIFFERENTIAL (CANCER CENTER ONLY)
Abs Immature Granulocytes: 0.01 K/uL (ref 0.00–0.07)
Basophils Absolute: 0 K/uL (ref 0.0–0.1)
Basophils Relative: 0 %
Eosinophils Absolute: 0.1 K/uL (ref 0.0–0.5)
Eosinophils Relative: 3 %
HCT: 23.4 % — ABNORMAL LOW (ref 39.0–52.0)
Hemoglobin: 7.4 g/dL — ABNORMAL LOW (ref 13.0–17.0)
Immature Granulocytes: 1 %
Lymphocytes Relative: 18 %
Lymphs Abs: 0.4 K/uL — ABNORMAL LOW (ref 0.7–4.0)
MCH: 34.1 pg — ABNORMAL HIGH (ref 26.0–34.0)
MCHC: 31.6 g/dL (ref 30.0–36.0)
MCV: 107.8 fL — ABNORMAL HIGH (ref 80.0–100.0)
Monocytes Absolute: 0.3 K/uL (ref 0.1–1.0)
Monocytes Relative: 13 %
Neutro Abs: 1.3 K/uL — ABNORMAL LOW (ref 1.7–7.7)
Neutrophils Relative %: 65 %
Platelet Count: 90 K/uL — ABNORMAL LOW (ref 150–400)
RBC: 2.17 MIL/uL — ABNORMAL LOW (ref 4.22–5.81)
RDW: 17.2 % — ABNORMAL HIGH (ref 11.5–15.5)
WBC Count: 1.9 K/uL — ABNORMAL LOW (ref 4.0–10.5)
nRBC: 0 % (ref 0.0–0.2)

## 2024-12-12 LAB — CMP (CANCER CENTER ONLY)
ALT: 10 U/L (ref 0–44)
AST: 23 U/L (ref 15–41)
Albumin: 3.6 g/dL (ref 3.5–5.0)
Alkaline Phosphatase: 60 U/L (ref 38–126)
Anion gap: 9 (ref 5–15)
BUN: 37 mg/dL — ABNORMAL HIGH (ref 8–23)
CO2: 24 mmol/L (ref 22–32)
Calcium: 8.7 mg/dL — ABNORMAL LOW (ref 8.9–10.3)
Chloride: 108 mmol/L (ref 98–111)
Creatinine: 1.67 mg/dL — ABNORMAL HIGH (ref 0.61–1.24)
GFR, Estimated: 40 mL/min — ABNORMAL LOW
Glucose, Bld: 98 mg/dL (ref 70–99)
Potassium: 4.5 mmol/L (ref 3.5–5.1)
Sodium: 142 mmol/L (ref 135–145)
Total Bilirubin: 0.3 mg/dL (ref 0.0–1.2)
Total Protein: 6.4 g/dL — ABNORMAL LOW (ref 6.5–8.1)

## 2024-12-12 LAB — VITAMIN B12: Vitamin B-12: 1109 pg/mL — ABNORMAL HIGH (ref 180–914)

## 2024-12-12 LAB — PSA: Prostatic Specific Antigen: 0.17 ng/mL (ref 0.00–4.00)

## 2024-12-12 LAB — FOLATE: Folate: 8.2 ng/mL

## 2024-12-12 NOTE — Telephone Encounter (Signed)
 Scheduled patient for next appointments. Called and spoke with the patient, he is aware.

## 2024-12-13 LAB — TESTOSTERONE: Testosterone: 5 ng/dL — ABNORMAL LOW (ref 264–916)

## 2024-12-15 LAB — METHYLMALONIC ACID, SERUM: Methylmalonic Acid, Quantitative: 269 nmol/L (ref 0–378)

## 2024-12-16 ENCOUNTER — Other Ambulatory Visit

## 2024-12-26 ENCOUNTER — Encounter (HOSPITAL_COMMUNITY): Admission: RE | Admit: 2024-12-26 | Discharge: 2024-12-26 | Disposition: A | Source: Ambulatory Visit

## 2024-12-26 DIAGNOSIS — C61 Malignant neoplasm of prostate: Secondary | ICD-10-CM | POA: Diagnosis present

## 2024-12-26 DIAGNOSIS — C799 Secondary malignant neoplasm of unspecified site: Secondary | ICD-10-CM | POA: Diagnosis present

## 2024-12-26 MED ORDER — FLOTUFOLASTAT F 18 GALLIUM 296-5846 MBQ/ML IV SOLN
8.1000 | Freq: Once | INTRAVENOUS | Status: AC
  Administered 2024-12-26: 8.1 via INTRAVENOUS

## 2024-12-30 ENCOUNTER — Ambulatory Visit: Payer: Self-pay

## 2024-12-30 ENCOUNTER — Other Ambulatory Visit: Payer: Self-pay | Admitting: *Deleted

## 2024-12-30 DIAGNOSIS — C61 Malignant neoplasm of prostate: Secondary | ICD-10-CM

## 2025-01-01 ENCOUNTER — Inpatient Hospital Stay

## 2025-01-01 VITALS — BP 125/36 | HR 73 | Temp 98.0°F | Resp 17 | Ht 67.5 in | Wt 205.0 lb

## 2025-01-01 DIAGNOSIS — M8589 Other specified disorders of bone density and structure, multiple sites: Secondary | ICD-10-CM | POA: Diagnosis not present

## 2025-01-01 DIAGNOSIS — D61818 Other pancytopenia: Secondary | ICD-10-CM

## 2025-01-01 DIAGNOSIS — C799 Secondary malignant neoplasm of unspecified site: Secondary | ICD-10-CM | POA: Diagnosis not present

## 2025-01-01 DIAGNOSIS — C61 Malignant neoplasm of prostate: Secondary | ICD-10-CM

## 2025-01-01 DIAGNOSIS — Z9189 Other specified personal risk factors, not elsewhere classified: Secondary | ICD-10-CM

## 2025-01-01 LAB — CBC WITH DIFFERENTIAL (CANCER CENTER ONLY)
Abs Immature Granulocytes: 0.01 10*3/uL (ref 0.00–0.07)
Basophils Absolute: 0 10*3/uL (ref 0.0–0.1)
Basophils Relative: 1 %
Eosinophils Absolute: 0.1 10*3/uL (ref 0.0–0.5)
Eosinophils Relative: 3 %
HCT: 25 % — ABNORMAL LOW (ref 39.0–52.0)
Hemoglobin: 8 g/dL — ABNORMAL LOW (ref 13.0–17.0)
Immature Granulocytes: 1 %
Lymphocytes Relative: 18 %
Lymphs Abs: 0.4 10*3/uL — ABNORMAL LOW (ref 0.7–4.0)
MCH: 34.9 pg — ABNORMAL HIGH (ref 26.0–34.0)
MCHC: 32 g/dL (ref 30.0–36.0)
MCV: 109.2 fL — ABNORMAL HIGH (ref 80.0–100.0)
Monocytes Absolute: 0.2 10*3/uL (ref 0.1–1.0)
Monocytes Relative: 11 %
Neutro Abs: 1.5 10*3/uL — ABNORMAL LOW (ref 1.7–7.7)
Neutrophils Relative %: 66 %
Platelet Count: 101 10*3/uL — ABNORMAL LOW (ref 150–400)
RBC: 2.29 MIL/uL — ABNORMAL LOW (ref 4.22–5.81)
RDW: 16.5 % — ABNORMAL HIGH (ref 11.5–15.5)
WBC Count: 2.2 10*3/uL — ABNORMAL LOW (ref 4.0–10.5)
nRBC: 0 % (ref 0.0–0.2)

## 2025-01-01 LAB — CMP (CANCER CENTER ONLY)
ALT: 12 U/L (ref 0–44)
AST: 23 U/L (ref 15–41)
Albumin: 3.9 g/dL (ref 3.5–5.0)
Alkaline Phosphatase: 66 U/L (ref 38–126)
Anion gap: 10 (ref 5–15)
BUN: 32 mg/dL — ABNORMAL HIGH (ref 8–23)
CO2: 24 mmol/L (ref 22–32)
Calcium: 8.7 mg/dL — ABNORMAL LOW (ref 8.9–10.3)
Chloride: 108 mmol/L (ref 98–111)
Creatinine: 1.6 mg/dL — ABNORMAL HIGH (ref 0.61–1.24)
GFR, Estimated: 42 mL/min — ABNORMAL LOW
Glucose, Bld: 89 mg/dL (ref 70–99)
Potassium: 4.6 mmol/L (ref 3.5–5.1)
Sodium: 142 mmol/L (ref 135–145)
Total Bilirubin: 0.3 mg/dL (ref 0.0–1.2)
Total Protein: 6.6 g/dL (ref 6.5–8.1)

## 2025-01-01 LAB — PSA: Prostatic Specific Antigen: 0.17 ng/mL (ref 0.00–4.00)

## 2025-01-01 LAB — VITAMIN B12: Vitamin B-12: 1066 pg/mL — ABNORMAL HIGH (ref 180–914)

## 2025-01-01 MED ORDER — SODIUM CHLORIDE 0.9 % IV SOLN: INTRAVENOUS | Status: DC

## 2025-01-01 MED ORDER — ZOLEDRONIC ACID 4 MG/100ML IV SOLN
4.0000 mg | Freq: Once | INTRAVENOUS | Status: AC
  Administered 2025-01-01: 4 mg via INTRAVENOUS
  Filled 2025-01-01: qty 100

## 2025-01-01 NOTE — Progress Notes (Signed)
 [facility] OFFICE PROGRESS NOTE  Patient Care Team: Hillman Bare, MD as PCP - General (Pulmonary Disease) Claudene Victory ORN, MD (Inactive) as PCP - Cardiology (Cardiology) Vertell Pont, RN as Oncology Nurse Navigator  Brandell is a 84 y.o.male with history of HTN, HLD, being [facility] for recurrent prostate cancer. 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 He had rising PSA and enrolled into [facility] in 2021. PSMA PET in September 2024 showed lymphadenopathy above and below the diaphragm. Repeat staging in 12/2023 showed progression metastatic adenopathy. He started docetaxel  on 01/14/24 at reduced dose due to his age.  Completed 2 cycles with continued rising PSA.  PSA peak at 240 on 02/18/2024.  Patient switched to Pluvicto  and completed 6 cycles as of October 21.  PSA nadir at 0.25 on 10/23/2024.   Negative genetic testing on the CancerNext+RNAinsight panel. RAD51D p.R165W (c.493C>T) VUS identified.   Current diagnosis: mCRPC Initial diagnosis: 10/15/2007.  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 Extensive perineural invasion. PSA in 12/2007 was up to 21.  Progression: PSA 0.1 in 12/2008 and 0.2 in 07/2009.  Germline [facility]ry testing negative. VUS RAD51D Somatic testing: TMB low. pMMR/MSI stable. PDL1 negative. HRD negative. Previous treatment: prostatectomy, salvage radiation, ADT/ARPI docetaxel . 02/05/24 completed cycle 2 with cytopenia. Switched to Pluvicto  10/21 completed Pluvicto  12/12/24 PSA 0.17 12/26/2024 PSMA PET showed NED.  Discussed excellent response from Pluvicto .  Will continue follow-up.  Last PSA was 1/9.  Will follow-up in early March.  Bone density last month showed osteopenia.  Right total  hip T-score -1.5.  Right femoral neck T-score -1.1.  Left total hip T-score -1.3.  Left femoral neck T-score -1.1.  He reports all dental procedures are completed.    Will repeat lab in about mid March with pancytopenia as well. 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 Continue dental care.  Will start zometa  twice a year Aggressive CV risk management. Other pancytopenia (HCC) Repeat labs in March again No deficiency in B12 and folate.  Orders Placed This Encounter  Procedures   CBC with Differential (Cancer Center Only)    Standing Status:   Future    Expiration Date:   01/01/2026   CMP (Cancer Center only)    Standing Status:   Future    Expiration Date:   01/01/2026   PSA    Standing Status:   Future    Expiration Date:   01/01/2026   Testosterone     Standing Status:   Future    Expiration Date:   01/01/2026   Lab on 3/10 at 3 and see me on 3/12 at 3   [name], MD  INTERVAL HISTORY: Patient returns for follow-up.  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 Extensive perineural invasion.   12/2008 Tumor Marker   PSA 0.1   07/2009 Tumor Marker   PSA 0.2   11/2021 Tumor Marker   PSA 4.26   03/2022 Tumor Marker   PSA 12.2   03/2022 -  Chemotherapy   Started Geni Report he could not take continuously due to cost.    12/2022 Tumor Marker   PSA 26   03/2023 Tumor Marker   PSA 33   07/2023 Tumor Marker   PSA 73.3   07/23/2023 Imaging   CT AP new pathologic retrocrural and para-aortic adenopathy, with enlarging lower thoracic para-aortic adenopathy; no definite pathologic adenopathy in pelvis.      08/15/2023 Procedure   A. LYMPH NODE, LEFT SIDED RETROPERITONEAL NODAL MASS, NEEDLE CORE BIOPSY:      Metastatic carcinoma, consistent with prostate primary.       See comment.   COMMENT:   Immunohistochemical stains for NKX3.1 and prostein were performed and  are positive in the tumor cells. The findings are supportive of  metastatic carcinoma with prostate primary.   Controls worked appropriately.    08/21/2023 PET scan   PSMA PET IMPRESSION: Prior prostatectomy. No evidence of local tumor recurrence.   Metastatic lymphadenopathy in the left lower neck, mediastinum, bilateral retrocrural regions, and abdominal retroperitoneum in left para-aortic region.   No evidence of pelvic metastatic lymphadenopathy or skeletal metastases.   Emphysema (ICD10-J43.9).   08/2023 Miscellaneous   Consultation with radiation oncology.  PSMA PET performed.  Radiation treatment is not a suitable option due to the extent of the disease.    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  RAD51D  p.R165W (c.493C>T) VUS identified.  The report date is January 01, 2024.  The [facility] CancerNext+RNAinsight Panel includes sequencing, rearrangement analysis, and RNA analysis for the following 39 genes: APC, ATM, BAP1, BARD1, BMPR1A, BRCA1, BRCA2, BRIP1, CDH1, CDKN2A, CHEK2, FH, FLCN, MET, MLH1, MSH2, MSH6, MUTYH, NF1, NTHL1, PALB2, PMS2, PTEN, RAD51C, RAD51D, SMAD4, STK11, TP53, TSC1, TSC2, and VHL (sequencing and  deletion/duplication); AXIN2, HOXB13, MBD4, MSH3, POLD1 and POLE (sequencing only); EPCAM and GREM1 (deletion/duplication only).    01/14/2024 - 02/05/2024 Chemotherapy   Patient is on Treatment Plan : PROSTATE Docetaxel  (75) + Prednisone  q14d     02/26/2024 - 09/23/2024 Radiation Therapy   Pluvicto   3/25 C1. PSA 240 on 3/17 5/6 C2 6/17 C3 7/29 C4 9/18 C5 10/21 C6 10/23/24 PSA 0.25      PHYSICAL EXAMINATION: ECOG PERFORMANCE STATUS: 1 - Symptomatic but completely ambulatory  Vitals:   01/01/25 1247  BP: (!) 125/36  Pulse: 73  Resp: 17  Temp: 98 F (36.7 C)  SpO2: 100%   Filed Weights   01/01/25 1247  Weight: 205 lb (93 kg)    GENERAL: alert, no distress and comfortable LUNGS:  normal breathing effort  Relevant data reviewed during this visit included labs.  New labs ordered.

## 2025-01-01 NOTE — Assessment & Plan Note (Addendum)
 Repeat labs in March again No deficiency in B12 and folate.

## 2025-01-01 NOTE — Patient Instructions (Signed)
 [facility] - A DEPT OF [facility]  Discharge Instructions: Thank you for choosing [facility] to provide your oncology and hematology care.   If you have a lab appointment with the Cancer Center, please go directly to the Cancer Center and check in at the registration area.   Wear comfortable clothing and clothing appropriate for easy access to any Portacath or PICC line.   We strive to give you quality time with your provider. You may need to reschedule your appointment if you arrive late (15 or more minutes).  Arriving late affects you and other patients whose appointments are after yours.  Also, if you miss three or more appointments without notifying the office, you may be dismissed from the clinic at the providers discretion.      For prescription refill requests, have your pharmacy contact our office and allow 72 hours for refills to be completed.    Today you received the following chemotherapy and/or immunotherapy agents: Zometa       To help prevent nausea and vomiting after your treatment, we encourage you to take your nausea medication as directed.  BELOW ARE SYMPTOMS THAT SHOULD BE REPORTED IMMEDIATELY: *FEVER GREATER THAN 100.4 F (38 C) OR HIGHER *CHILLS OR SWEATING *NAUSEA AND VOMITING THAT IS NOT CONTROLLED WITH YOUR NAUSEA MEDICATION *UNUSUAL SHORTNESS OF BREATH *UNUSUAL BRUISING OR BLEEDING *URINARY PROBLEMS (pain or burning when urinating, or frequent urination) *BOWEL PROBLEMS (unusual diarrhea, constipation, pain near the anus) TENDERNESS IN MOUTH AND THROAT WITH OR WITHOUT PRESENCE OF ULCERS (sore throat, sores in mouth, or a toothache) UNUSUAL RASH, SWELLING OR PAIN  UNUSUAL VAGINAL DISCHARGE OR ITCHING   Items with * indicate a potential emergency and should be followed up as soon as possible or go to the Emergency Department if any problems should occur.  Please show the CHEMOTHERAPY ALERT CARD or IMMUNOTHERAPY  ALERT CARD at check-in to the Emergency Department and triage nurse.  Should you have questions after your visit or need to cancel or reschedule your appointment, please contact [facility] - A DEPT OF [facility]  Dept: [phone]  and follow the prompts.  Office hours are 8:00 a.m. to 4:30 p.m. Monday - Friday. Please note that voicemails left after 4:00 p.m. may not be returned until the following business day.  We are closed weekends and major holidays. You have access to a nurse at all times for urgent questions. Please call the main number to the clinic Dept: [phone] and follow the prompts.   For any non-urgent questions, you may also contact your provider using MyChart. We now offer e-Visits for anyone 37 and older to request care online for non-urgent symptoms. For details visit [email or web address].   Also download the MyChart app! Go to the app store, search MyChart, open the app, select [facility], and log in with your MyChart username and password.  Zoledronic  Acid Injection (Cancer) What is this medication? ZOLEDRONIC  ACID (ZOE le dron ik AS id) treats high calcium levels in the blood caused by cancer. It may also be used with chemotherapy to treat weakened bones caused by cancer. It works by slowing down the release of calcium from bones. This lowers calcium levels in your blood. It also makes your bones stronger and less likely to break (fracture). It belongs to a group of medications called bisphosphonates. This medicine may be used for other purposes; ask your health care provider  or pharmacist if you have questions. COMMON BRAND NAME(S): Zometa , Zometa  Powder What should I tell my care team before I take this medication? They need to know if you have any of these conditions: Dehydration Dental disease Kidney disease Liver disease Low levels of calcium in the blood Lung or breathing disease, such as asthma Receiving steroids, such as  dexamethasone or prednisone An unusual or allergic reaction to zoledronic  acid, other medications, foods, dyes, or preservatives Pregnant or trying to get pregnant Breast-feeding How should I use this medication? This medication is injected into a vein. It is given by your care team in a hospital or clinic setting. Talk to your care team about the use of this medication in children. Special care may be needed. Overdosage: If you think you have taken too much of this medicine contact a poison control center or emergency room at once. NOTE: This medicine is only for you. Do not share this medicine with others. What if I miss a dose? Keep appointments for follow-up doses. It is important not to miss your dose. Call your care team if you are unable to keep an appointment. What may interact with this medication? Certain antibiotics given by injection Diuretics, such as bumetanide, furosemide NSAIDs, medications for pain and inflammation, such as ibuprofen or naproxen Teriparatide Thalidomide This list may not describe all possible interactions. Give your health care provider a list of all the medicines, herbs, non-prescription drugs, or dietary supplements you use. Also tell them if you smoke, drink alcohol, or use illegal drugs. Some items may interact with your medicine. What should I watch for while using this medication? Visit your care team for regular checks on your progress. It may be some time before you see the benefit from this medication. Some people who take this medication have severe bone, joint, or muscle pain. This medication may also increase your risk for jaw problems or a broken thigh bone. Tell your care team right away if you have severe pain in your jaw, bones, joints, or muscles. Tell you care team if you have any pain that does not go away or that gets worse. Tell your dentist and dental surgeon that you are taking this medication. You should not have major dental surgery  while on this medication. See your dentist to have a dental exam and fix any dental problems before starting this medication. Take good care of your teeth while on this medication. Make sure you see your dentist for regular follow-up appointments. You should make sure you get enough calcium and vitamin D while you are taking this medication. Discuss the foods you eat and the vitamins you take with your care team. Check with your care team if you have severe diarrhea, nausea, and vomiting, or if you sweat a lot. The loss of too much body fluid may make it dangerous for you to take this medication. You may need bloodwork while taking this medication. Talk to your care team if you wish to become pregnant or think you might be pregnant. This medication can cause serious birth defects. What side effects may I notice from receiving this medication? Side effects that you should report to your care team as soon as possible: Allergic reactions--skin rash, itching, hives, swelling of the face, lips, tongue, or throat Kidney injury--decrease in the amount of urine, swelling of the ankles, hands, or feet Low calcium level--muscle pain or cramps, confusion, tingling, or numbness in the hands or feet Osteonecrosis of the jaw--pain, swelling, or redness  in the mouth, numbness of the jaw, poor healing after dental work, unusual discharge from the mouth, visible bones in the mouth Severe bone, joint, or muscle pain Side effects that usually do not require medical attention (report to your care team if they continue or are bothersome): Constipation Fatigue Fever Loss of appetite Nausea Stomach pain This list may not describe all possible side effects. Call your doctor for medical advice about side effects. You may report side effects to FDA at 1-800-FDA-1088. Where should I keep my medication? This medication is given in a hospital or clinic. It will not be stored at home. NOTE: This sheet is a summary. It may  not cover all possible information. If you have questions about this medicine, talk to your doctor, pharmacist, or health care provider.  2024 [employer]vier/Gold Standard (2022-01-13 00:00:00)

## 2025-01-01 NOTE — Assessment & Plan Note (Addendum)
 Recommend start Zometa  twice yearly, starting today Next dental appt next week Continue vitamin D and calcium

## 2025-01-01 NOTE — Assessment & Plan Note (Addendum)
 Continue vit D and calcium Continue exercises and walking as able. Continue dental care.  Will start zometa twice a year Aggressive CV risk management.

## 2025-01-01 NOTE — Assessment & Plan Note (Addendum)
 Repeat labs and visit in March Next Eligard  at [facility] in April If PSA start rising consider adding Provenge

## 2025-01-01 NOTE — Progress Notes (Signed)
 Per Dr. Tina, okay to treat with Zometa  today with corrected calcium 8.8. Dose appropriate for renal function and indication.  Harlene Nasuti, PharmD Oncology Infusion Pharmacist 01/01/2025 2:29 PM

## 2025-01-02 LAB — TESTOSTERONE: Testosterone: <3 ng/dL — ABNORMAL LOW (ref 264–916)

## 2025-02-10 ENCOUNTER — Inpatient Hospital Stay

## 2025-02-12 ENCOUNTER — Inpatient Hospital Stay
